# Patient Record
Sex: Female | Born: 1970 | Race: Black or African American | Hispanic: No | Marital: Single | Smoking: Current every day smoker
Health system: Southern US, Community
[De-identification: ages and names within clinical notes are randomized; demographics above are authoritative.]

## PROBLEM LIST (undated history)

## (undated) DIAGNOSIS — I10 Essential (primary) hypertension: Secondary | ICD-10-CM

## (undated) DIAGNOSIS — E119 Type 2 diabetes mellitus without complications: Secondary | ICD-10-CM

## (undated) DIAGNOSIS — K219 Gastro-esophageal reflux disease without esophagitis: Secondary | ICD-10-CM

## (undated) DIAGNOSIS — N200 Calculus of kidney: Secondary | ICD-10-CM

## (undated) DIAGNOSIS — E785 Hyperlipidemia, unspecified: Secondary | ICD-10-CM

## (undated) DIAGNOSIS — E669 Obesity, unspecified: Secondary | ICD-10-CM

## (undated) HISTORY — DX: Hyperlipidemia, unspecified: E78.5

## (undated) HISTORY — PX: TUBAL LIGATION: SHX77

## (undated) HISTORY — DX: Gastro-esophageal reflux disease without esophagitis: K21.9

## (undated) HISTORY — PX: HERNIA REPAIR: SHX51

## (undated) HISTORY — PX: LITHOTRIPSY: SUR834

## (undated) HISTORY — PX: ABLATION ON ENDOMETRIOSIS: SHX5787

---

## 2013-02-16 DIAGNOSIS — M546 Pain in thoracic spine: Secondary | ICD-10-CM | POA: Insufficient documentation

## 2013-02-16 DIAGNOSIS — G43909 Migraine, unspecified, not intractable, without status migrainosus: Secondary | ICD-10-CM | POA: Insufficient documentation

## 2013-02-16 DIAGNOSIS — R1310 Dysphagia, unspecified: Secondary | ICD-10-CM | POA: Insufficient documentation

## 2013-02-16 DIAGNOSIS — K439 Ventral hernia without obstruction or gangrene: Secondary | ICD-10-CM | POA: Insufficient documentation

## 2013-09-18 DIAGNOSIS — N920 Excessive and frequent menstruation with regular cycle: Secondary | ICD-10-CM | POA: Insufficient documentation

## 2013-09-18 DIAGNOSIS — R5381 Other malaise: Secondary | ICD-10-CM | POA: Insufficient documentation

## 2013-09-18 DIAGNOSIS — N949 Unspecified condition associated with female genital organs and menstrual cycle: Secondary | ICD-10-CM | POA: Insufficient documentation

## 2013-09-18 DIAGNOSIS — N921 Excessive and frequent menstruation with irregular cycle: Secondary | ICD-10-CM | POA: Insufficient documentation

## 2013-12-31 DIAGNOSIS — E871 Hypo-osmolality and hyponatremia: Secondary | ICD-10-CM | POA: Insufficient documentation

## 2013-12-31 DIAGNOSIS — F17200 Nicotine dependence, unspecified, uncomplicated: Secondary | ICD-10-CM | POA: Insufficient documentation

## 2013-12-31 DIAGNOSIS — D259 Leiomyoma of uterus, unspecified: Secondary | ICD-10-CM | POA: Insufficient documentation

## 2013-12-31 DIAGNOSIS — J309 Allergic rhinitis, unspecified: Secondary | ICD-10-CM | POA: Insufficient documentation

## 2013-12-31 DIAGNOSIS — R32 Unspecified urinary incontinence: Secondary | ICD-10-CM | POA: Insufficient documentation

## 2013-12-31 DIAGNOSIS — D509 Iron deficiency anemia, unspecified: Secondary | ICD-10-CM | POA: Insufficient documentation

## 2014-03-17 DIAGNOSIS — N83209 Unspecified ovarian cyst, unspecified side: Secondary | ICD-10-CM | POA: Insufficient documentation

## 2014-07-15 DIAGNOSIS — E876 Hypokalemia: Secondary | ICD-10-CM | POA: Insufficient documentation

## 2014-07-15 DIAGNOSIS — E282 Polycystic ovarian syndrome: Secondary | ICD-10-CM | POA: Insufficient documentation

## 2014-07-15 DIAGNOSIS — K5909 Other constipation: Secondary | ICD-10-CM | POA: Insufficient documentation

## 2014-07-15 DIAGNOSIS — E119 Type 2 diabetes mellitus without complications: Secondary | ICD-10-CM | POA: Insufficient documentation

## 2014-10-02 DIAGNOSIS — Z5181 Encounter for therapeutic drug level monitoring: Secondary | ICD-10-CM | POA: Insufficient documentation

## 2014-10-23 DIAGNOSIS — G5603 Carpal tunnel syndrome, bilateral upper limbs: Secondary | ICD-10-CM | POA: Insufficient documentation

## 2014-11-11 DIAGNOSIS — R0789 Other chest pain: Secondary | ICD-10-CM | POA: Insufficient documentation

## 2014-12-02 DIAGNOSIS — G894 Chronic pain syndrome: Secondary | ICD-10-CM | POA: Insufficient documentation

## 2015-01-20 DIAGNOSIS — S93402A Sprain of unspecified ligament of left ankle, initial encounter: Secondary | ICD-10-CM | POA: Insufficient documentation

## 2015-01-20 DIAGNOSIS — S8263XA Displaced fracture of lateral malleolus of unspecified fibula, initial encounter for closed fracture: Secondary | ICD-10-CM | POA: Insufficient documentation

## 2015-03-09 DIAGNOSIS — R102 Pelvic and perineal pain: Secondary | ICD-10-CM | POA: Insufficient documentation

## 2015-03-09 DIAGNOSIS — F410 Panic disorder [episodic paroxysmal anxiety] without agoraphobia: Secondary | ICD-10-CM | POA: Insufficient documentation

## 2015-03-09 DIAGNOSIS — R3589 Other polyuria: Secondary | ICD-10-CM | POA: Insufficient documentation

## 2015-03-09 DIAGNOSIS — F5101 Primary insomnia: Secondary | ICD-10-CM | POA: Insufficient documentation

## 2015-08-11 DIAGNOSIS — N1330 Unspecified hydronephrosis: Secondary | ICD-10-CM | POA: Insufficient documentation

## 2015-08-11 DIAGNOSIS — N201 Calculus of ureter: Secondary | ICD-10-CM | POA: Insufficient documentation

## 2017-03-07 ENCOUNTER — Emergency Department (HOSPITAL_BASED_OUTPATIENT_CLINIC_OR_DEPARTMENT_OTHER)
Admission: EM | Admit: 2017-03-07 | Discharge: 2017-03-07 | Disposition: A | Discharge status: Home / Self Care | Payer: Self-pay | Attending: Emergency Medicine | Admitting: Emergency Medicine

## 2017-03-07 ENCOUNTER — Encounter (HOSPITAL_BASED_OUTPATIENT_CLINIC_OR_DEPARTMENT_OTHER): Payer: Self-pay | Admitting: *Deleted

## 2017-03-07 ENCOUNTER — Emergency Department (HOSPITAL_BASED_OUTPATIENT_CLINIC_OR_DEPARTMENT_OTHER): Payer: Self-pay

## 2017-03-07 DIAGNOSIS — F17210 Nicotine dependence, cigarettes, uncomplicated: Secondary | ICD-10-CM | POA: Insufficient documentation

## 2017-03-07 DIAGNOSIS — E119 Type 2 diabetes mellitus without complications: Secondary | ICD-10-CM | POA: Insufficient documentation

## 2017-03-07 DIAGNOSIS — Z7984 Long term (current) use of oral hypoglycemic drugs: Secondary | ICD-10-CM | POA: Insufficient documentation

## 2017-03-07 DIAGNOSIS — Z79899 Other long term (current) drug therapy: Secondary | ICD-10-CM | POA: Insufficient documentation

## 2017-03-07 DIAGNOSIS — J181 Lobar pneumonia, unspecified organism: Secondary | ICD-10-CM

## 2017-03-07 DIAGNOSIS — J189 Pneumonia, unspecified organism: Secondary | ICD-10-CM | POA: Insufficient documentation

## 2017-03-07 DIAGNOSIS — I10 Essential (primary) hypertension: Secondary | ICD-10-CM | POA: Insufficient documentation

## 2017-03-07 HISTORY — DX: Essential (primary) hypertension: I10

## 2017-03-07 HISTORY — DX: Type 2 diabetes mellitus without complications: E11.9

## 2017-03-07 HISTORY — DX: Calculus of kidney: N20.0

## 2017-03-07 MED ORDER — ACETAMINOPHEN 325 MG PO TABS
650.0000 mg | ORAL_TABLET | Freq: Once | ORAL | Status: AC
  Administered 2017-03-07: 650 mg via ORAL
  Filled 2017-03-07: qty 2

## 2017-03-07 MED ORDER — IPRATROPIUM-ALBUTEROL 0.5-2.5 (3) MG/3ML IN SOLN
3.0000 mL | Freq: Four times a day (QID) | RESPIRATORY_TRACT | Status: DC
  Administered 2017-03-07: 3 mL via RESPIRATORY_TRACT
  Filled 2017-03-07: qty 3

## 2017-03-07 MED ORDER — ALBUTEROL SULFATE HFA 108 (90 BASE) MCG/ACT IN AERS: 1.0000 | INHALATION_SPRAY | Freq: Four times a day (QID) | RESPIRATORY_TRACT | 0 refills | Status: DC | PRN

## 2017-03-07 MED ORDER — ALBUTEROL SULFATE HFA 108 (90 BASE) MCG/ACT IN AERS
2.0000 | INHALATION_SPRAY | Freq: Four times a day (QID) | RESPIRATORY_TRACT | Status: DC
  Administered 2017-03-07: 2 via RESPIRATORY_TRACT
  Filled 2017-03-07: qty 6.7

## 2017-03-07 MED ORDER — AZITHROMYCIN 250 MG PO TABS
500.0000 mg | ORAL_TABLET | Freq: Once | ORAL | Status: AC
  Administered 2017-03-07: 500 mg via ORAL
  Filled 2017-03-07: qty 2

## 2017-03-07 MED ORDER — ALBUTEROL SULFATE HFA 108 (90 BASE) MCG/ACT IN AERS: 2.0000 | INHALATION_SPRAY | RESPIRATORY_TRACT | Status: DC | PRN

## 2017-03-07 MED ORDER — AZITHROMYCIN 250 MG PO TABS: 250.0000 mg | ORAL_TABLET | Freq: Once | ORAL | 0 refills | Status: AC

## 2017-03-07 NOTE — ED Triage Notes (Signed)
Pt c/o cough and SOB x 3 days

## 2017-03-07 NOTE — Discharge Instructions (Signed)
You had focal wheezing on your left lung, chest x-ray confirmed pneumonia. You were able to ambulate in the ED with normal oxygen saturations. You are considered appropriate for outpatient management of pneumonia with antibiotics.   We will treat your community-acquired pneumonia with this azythromycin. You received the first dose in the ED. Please take remaining doses as prescribed. Additionally use albuterol inhaler for associated chest tightness and cough. Drink plenty of fluids to ensure you're hydrated. Take ibuprofen and Tylenol for generalized body aches. You need a repeat chest x-ray within 1 week to ensure resolution of your ammonia. Please contact your primary care provider tomorrow to make an appointment for this chest x-ray. Monitor for signs of worsening illness including worsening cough, shortness of breath, chest pain, persistent fever, difficulty breathing.

## 2017-03-07 NOTE — ED Provider Notes (Signed)
[facility] Provider Note   CSN: [id number] Arrival date & time: 03/07/17  1916  By signing my name below, I, Dora Sims, attest that this documentation has been prepared under the direction and in the presence of Carmon Sails, PA-C. Electronically Signed: Dora Sims, Scribe. 03/07/2017. 7:57 PM.  History   Chief Complaint Chief Complaint  Patient presents with  . Cough   The history is provided by the patient. No language interpreter was used.    HPI Comments: Kristine Bell is a 46 y.o. female with PMHx of DM and HTN who presents to the Emergency Department complaining of a persistent cough that is occasionally productive of non-bloody sputum for three days. She reports associated rhinorrhea, sore throat, intermittent fevers, mild dyspnea, chest pain secondary to coughing, fatigue, nausea without vomiting, and generalized body aches. She has tried TheraFlu, DayQuil, and Alka-Seltzer Plus without relief. No known ill contacts with similar symptoms and she is not frequently exposed to children. No recent unusual outdoor exposure or tick bites. Patient smokes 3-4 cigarettes daily and has not smoked since onset of her cough and associated symptoms. No h/o CHF, COPD, or asthma. She denies abdominal pain, dysuria, hemoptysis, or any other associated symptoms.  Past Medical History:  Diagnosis Date  . Diabetes mellitus without complication ([place])   . Hypertension   . Kidney stones     There are no active problems to display for this patient.   Past Surgical History:  Procedure Laterality Date  . ABLATION ON ENDOMETRIOSIS    . HERNIA REPAIR    . LITHOTRIPSY    . TUBAL LIGATION      OB History    No data available       Home Medications    Prior to Admission medications   Medication Sig Start Date End Date Taking? Authorizing Provider  lisinopril (PRINIVIL,ZESTRIL) 20 MG tablet Take 20 mg by mouth daily.   Yes [provider]  metFORMIN  (GLUCOPHAGE) 500 MG tablet Take by mouth 2 (two) times daily with a meal.   Yes [provider]  albuterol (PROVENTIL HFA;VENTOLIN HFA) 108 (90 Base) MCG/ACT inhaler Inhale 1-2 puffs into the lungs every 6 (six) hours as needed for wheezing or shortness of breath. 03/07/17   Kinnie Feil, PA-C  azithromycin (ZITHROMAX Z-PAK) 250 MG tablet Take 1 tablet (250 mg total) by mouth once. 03/07/17 03/07/17  Kinnie Feil, PA-C    Family History History reviewed. No pertinent family history.  Social History Social History  Substance Use Topics  . Smoking status: Current Every Day Smoker    Packs/day: 0.50    Types: Cigarettes  . Smokeless tobacco: Never Used  . Alcohol use No     Allergies   Naproxen and Tramadol   Review of Systems Review of Systems  Constitutional: Positive for fatigue and fever.  HENT: Positive for rhinorrhea and sore throat.   Respiratory: Positive for cough and shortness of breath.        Negative for hemoptysis.  Cardiovascular: Positive for chest pain (secondary to coughing).  Gastrointestinal: Positive for nausea. Negative for abdominal pain and vomiting.  Genitourinary: Negative for dysuria.  Musculoskeletal: Positive for myalgias.   Physical Exam Updated Vital Signs BP (!) 163/82 (BP Location: Left Arm)   Pulse 79   Temp (!) 100.4 F (38 C) (Oral)   Resp 20   Ht 5\' 5"  (1.651 m)   Wt 136.1 kg (300 lb)   SpO2 99%   BMI 49.92  kg/m   Physical Exam  Constitutional: She is oriented to person, place, and time. She appears well-developed and well-nourished. No distress.  NAD.  HENT:  Head: Normocephalic and atraumatic.  Right Ear: External ear normal.  Left Ear: External ear normal.  Nose: Nose normal.  Moderate mucosal edema bilaterally. Tonsils are erythematous without edema, exudates, or petechiae.  Eyes: Conjunctivae and EOM are normal. No scleral icterus.  Neck: Normal range of motion. Neck supple.  Cardiovascular: Normal  rate, regular rhythm and normal heart sounds.   No murmur heard. No signs of fluid overload including lower extremity edema or JVD. DP and radial pulses are 2+ bilaterally.  Pulmonary/Chest: Effort normal. She has wheezes. She has rhonchi. She exhibits tenderness (right anterior chest wall).  Expiratory wheezing and rhonchi to all fields of the left lung. Lung sounds on the right are normal.  Musculoskeletal: Normal range of motion. She exhibits no deformity.  Lymphadenopathy:       Head (right side): Submandibular adenopathy present.       Head (left side): Submandibular adenopathy present.  Tender submandibular lymphadenopathy that is symmetric.  Neurological: She is alert and oriented to person, place, and time.  Skin: Skin is warm and dry. Capillary refill takes less than 2 seconds.  Psychiatric: She has a normal mood and affect. Her behavior is normal. Judgment and thought content normal.  Nursing note and vitals reviewed.  ED Treatments / Results  Labs (all labs ordered are listed, but only abnormal results are displayed) Labs Reviewed - No data to display  EKG  EKG Interpretation None       Radiology Dg Chest 2 View  Result Date: 03/07/2017 CLINICAL DATA:  Fever and cough for 3 days. EXAM: CHEST  2 VIEW COMPARISON:  None. FINDINGS: Focal opacity obscures the left heart border, suggesting pneumonia. Right lung is clear. Cardiopericardial silhouette is upper limits of normal for size to borderline enlarged. The visualized bony structures of the thorax are intact. IMPRESSION: 1. Focal opacity left mid lung suspicious for pneumonia. Follow-up chest x-ray recommended to ensure resolution. 2. Borderline enlargement cardiopericardial silhouette. Electronically Signed   By: Misty Stanley M.D.   On: 03/07/2017 20:12    Procedures Procedures (including critical care time)  DIAGNOSTIC STUDIES: Oxygen Saturation is 99% on RA, normal by my interpretation.    COORDINATION OF  CARE: 7:39 PM Discussed treatment plan with pt at bedside and pt agreed to plan.  Medications Ordered in ED Medications  ipratropium-albuterol (DUONEB) 0.5-2.5 (3) MG/3ML nebulizer solution 3 mL (3 mLs Nebulization Given 03/07/17 2108)  acetaminophen (TYLENOL) tablet 650 mg (650 mg Oral Given 03/07/17 2102)  azithromycin (ZITHROMAX) tablet 500 mg (500 mg Oral Given 03/07/17 2145)     Initial Impression / Assessment and Plan / ED Course  I have reviewed the triage vital signs and the nursing notes.  Pertinent labs & imaging results that were available during my care of the patient were reviewed by me and considered in my medical decision making (see chart for details).  Clinical Course as of Mar 07 2145  Tue Mar 07, 2017  2058 IMPRESSION: 1. Focal opacity left mid lung suspicious for pneumonia. Follow-up chest x-ray recommended to ensure resolution. 2. Borderline enlargement cardiopericardial silhouette. Temp: (!) 100.4 F (38 C) [CG]    Clinical Course User Index [CG] Kinnie Feil, PA-C   46 y.o. yo female with pertinent pmh of non insulin dependent DM, HTN and tobacco abuse presents to ED with signs and symptoms  suggestive of community acquired pneumonia including fever, cough, pleuritic chest pain, SOB, and sputum production.  On physical examination,patient is febrile.  Patient is non toxic appearing, speaking in full sentences with no signs of respiratory distress.  Chest examination revealed focal wheezing and rhonchi to LLB suggestive of consolidation. CXR done today revealed L infiltrate.  Given reassuring vital signs lab work not indicated at this time.  Given physical exam findings, age, comorbidities pt is considered low risk for complications with outpatient antibiotic tx for CAP and that outpatient abx is appropriate.  Will discharge patient with azithromycin and adjunctive symptomatic treatment with close PCP f/u for repeat CXR to ensure clearance.  Pt ambulated in ED and  SpO2 remained >95%. Patient verbalized understanding and is agreeable to discharge plan.  Strict ED return precautions given, patient is aware of red flag symptoms to monitor for that would warrant return to ED for further evaluation.  Vital signs were within normal limits prior to discharge.   Final Clinical Impressions(s) / ED Diagnoses   Final diagnoses:  Community acquired pneumonia of left lower lobe of lung (HCC)    New Prescriptions New Prescriptions   ALBUTEROL (PROVENTIL HFA;VENTOLIN HFA) 108 (90 BASE) MCG/ACT INHALER    Inhale 1-2 puffs into the lungs every 6 (six) hours as needed for wheezing or shortness of breath.   AZITHROMYCIN (ZITHROMAX Z-PAK) 250 MG TABLET    Take 1 tablet (250 mg total) by mouth once.   I personally performed the services described in this documentation, which was scribed in my presence. The recorded information has been reviewed and is accurate.    Arlean Hopping 03/07/17 2147    Quintella Reichert, MD 03/08/17 1455

## 2017-03-07 NOTE — ED Notes (Signed)
SPO2 as low as 94% and as high 97%. HR ranged between 92-190. RN and PA notified.

## 2017-03-07 NOTE — ED Notes (Signed)
Pt verbalizes understanding of d/c instructions and denies any further needs at this time. 

## 2017-03-08 ENCOUNTER — Telehealth (HOSPITAL_BASED_OUTPATIENT_CLINIC_OR_DEPARTMENT_OTHER): Payer: Self-pay | Admitting: *Deleted

## 2017-03-08 MED FILL — AZITHROMYCIN 250 MG TABLET: 250 | 4 days supply | Qty: 4 | Fill #0

## 2017-03-08 NOTE — Telephone Encounter (Signed)
Pt called requesting financial assistance with Rx received yesterday for zithromax. Advised pt it would be $9 to have it filled here at in the [facility] or she could contact [facility] and they could advise her if they could fill it more inexpensively. Pt verbalized understanding

## 2017-09-08 ENCOUNTER — Encounter (HOSPITAL_BASED_OUTPATIENT_CLINIC_OR_DEPARTMENT_OTHER): Payer: Self-pay | Admitting: Emergency Medicine

## 2017-09-08 ENCOUNTER — Emergency Department (HOSPITAL_BASED_OUTPATIENT_CLINIC_OR_DEPARTMENT_OTHER): Payer: Self-pay

## 2017-09-08 ENCOUNTER — Other Ambulatory Visit: Payer: Self-pay

## 2017-09-08 ENCOUNTER — Emergency Department (HOSPITAL_BASED_OUTPATIENT_CLINIC_OR_DEPARTMENT_OTHER)
Admission: EM | Admit: 2017-09-08 | Discharge: 2017-09-08 | Disposition: A | Discharge status: Home / Self Care | Payer: Self-pay | Attending: Emergency Medicine | Admitting: Emergency Medicine

## 2017-09-08 DIAGNOSIS — Z79899 Other long term (current) drug therapy: Secondary | ICD-10-CM | POA: Insufficient documentation

## 2017-09-08 DIAGNOSIS — Z7984 Long term (current) use of oral hypoglycemic drugs: Secondary | ICD-10-CM | POA: Insufficient documentation

## 2017-09-08 DIAGNOSIS — J101 Influenza due to other identified influenza virus with other respiratory manifestations: Secondary | ICD-10-CM

## 2017-09-08 DIAGNOSIS — F17210 Nicotine dependence, cigarettes, uncomplicated: Secondary | ICD-10-CM | POA: Insufficient documentation

## 2017-09-08 DIAGNOSIS — E119 Type 2 diabetes mellitus without complications: Secondary | ICD-10-CM | POA: Insufficient documentation

## 2017-09-08 DIAGNOSIS — I10 Essential (primary) hypertension: Secondary | ICD-10-CM | POA: Insufficient documentation

## 2017-09-08 DIAGNOSIS — R6889 Other general symptoms and signs: Secondary | ICD-10-CM

## 2017-09-08 DIAGNOSIS — J09X2 Influenza due to identified novel influenza A virus with other respiratory manifestations: Secondary | ICD-10-CM | POA: Insufficient documentation

## 2017-09-08 LAB — CBC WITH DIFFERENTIAL/PLATELET
Basophils Absolute: 0.1 10*3/uL (ref 0.0–0.1)
Basophils Relative: 1 %
Eosinophils Absolute: 0.1 10*3/uL (ref 0.0–0.7)
Eosinophils Relative: 1 %
HCT: 36.4 % (ref 36.0–46.0)
Hemoglobin: 11.8 g/dL — ABNORMAL LOW (ref 12.0–15.0)
Lymphocytes Relative: 8 %
Lymphs Abs: 0.8 10*3/uL (ref 0.7–4.0)
MCH: 26.8 pg (ref 26.0–34.0)
MCHC: 32.4 g/dL (ref 30.0–36.0)
MCV: 82.5 fL (ref 78.0–100.0)
Monocytes Absolute: 0.7 10*3/uL (ref 0.1–1.0)
Monocytes Relative: 7 %
Neutro Abs: 8.7 10*3/uL — ABNORMAL HIGH (ref 1.7–7.7)
Neutrophils Relative %: 85 %
Platelets: 232 10*3/uL (ref 150–400)
RBC: 4.41 MIL/uL (ref 3.87–5.11)
RDW: 15.2 % (ref 11.5–15.5)
WBC: 10.3 10*3/uL (ref 4.0–10.5)

## 2017-09-08 LAB — URINALYSIS, ROUTINE W REFLEX MICROSCOPIC
Bilirubin Urine: NEGATIVE
GLUCOSE, UA: NEGATIVE mg/dL
KETONES UR: NEGATIVE mg/dL
LEUKOCYTES UA: NEGATIVE
Nitrite: NEGATIVE
PH: 6 (ref 5.0–8.0)
PROTEIN: NEGATIVE mg/dL
Specific Gravity, Urine: 1.02 (ref 1.005–1.030)

## 2017-09-08 LAB — COMPREHENSIVE METABOLIC PANEL
ALT: 16 U/L (ref 14–54)
AST: 28 U/L (ref 15–41)
Albumin: 3.9 g/dL (ref 3.5–5.0)
Alkaline Phosphatase: 89 U/L (ref 38–126)
Anion gap: 10 (ref 5–15)
BUN: 12 mg/dL (ref 6–20)
CO2: 21 mmol/L — ABNORMAL LOW (ref 22–32)
Calcium: 9.8 mg/dL (ref 8.9–10.3)
Chloride: 104 mmol/L (ref 101–111)
Creatinine, Ser: 0.51 mg/dL (ref 0.44–1.00)
GFR calc Af Amer: >60 mL/min (ref >60)
GFR calc non Af Amer: >60 mL/min (ref >60)
Glucose, Bld: 118 mg/dL — ABNORMAL HIGH (ref 65–99)
Potassium: 3.4 mmol/L — ABNORMAL LOW (ref 3.5–5.1)
Sodium: 135 mmol/L (ref 135–145)
Total Bilirubin: 0.4 mg/dL (ref 0.3–1.2)
Total Protein: 7.3 g/dL (ref 6.5–8.1)

## 2017-09-08 LAB — URINALYSIS, MICROSCOPIC (REFLEX): WBC, UA: NONE SEEN WBC/hpf (ref 0–5)

## 2017-09-08 LAB — INFLUENZA PANEL BY PCR (TYPE A & B)
Influenza A By PCR: POSITIVE — AB
Influenza B By PCR: NEGATIVE

## 2017-09-08 LAB — RAPID STREP SCREEN (MED CTR MEBANE ONLY): Streptococcus, Group A Screen (Direct): NEGATIVE

## 2017-09-08 MED ORDER — OSELTAMIVIR PHOSPHATE 75 MG PO CAPS: 75.0000 mg | ORAL_CAPSULE | Freq: Two times a day (BID) | ORAL | 0 refills | Status: AC

## 2017-09-08 MED ORDER — BENZONATATE 100 MG PO CAPS: 100.0000 mg | ORAL_CAPSULE | Freq: Three times a day (TID) | ORAL | 0 refills | Status: DC

## 2017-09-08 MED ORDER — KETOROLAC TROMETHAMINE 15 MG/ML IJ SOLN
15.0000 mg | Freq: Once | INTRAMUSCULAR | Status: AC
  Administered 2017-09-08: 15 mg via INTRAVENOUS
  Filled 2017-09-08: qty 1

## 2017-09-08 MED ORDER — ONDANSETRON 4 MG PO TBDP
4.0000 mg | ORAL_TABLET | Freq: Once | ORAL | Status: AC
  Administered 2017-09-08: 4 mg via ORAL
  Filled 2017-09-08: qty 1

## 2017-09-08 MED ORDER — ACETAMINOPHEN 325 MG PO TABS
650.0000 mg | ORAL_TABLET | Freq: Once | ORAL | Status: AC
  Administered 2017-09-08: 650 mg via ORAL
  Filled 2017-09-08: qty 2

## 2017-09-08 MED ORDER — ONDANSETRON 4 MG PO TBDP: 4.0000 mg | ORAL_TABLET | Freq: Three times a day (TID) | ORAL | 0 refills | Status: DC | PRN

## 2017-09-08 MED ORDER — CYCLOBENZAPRINE HCL 10 MG PO TABS: 10.0000 mg | ORAL_TABLET | Freq: Two times a day (BID) | ORAL | 0 refills | Status: DC | PRN

## 2017-09-08 MED ORDER — ACETAMINOPHEN 500 MG PO TABS
1000.0000 mg | ORAL_TABLET | Freq: Once | ORAL | Status: AC
  Administered 2017-09-08: 1000 mg via ORAL
  Filled 2017-09-08: qty 2

## 2017-09-08 MED ORDER — ALBUTEROL SULFATE HFA 108 (90 BASE) MCG/ACT IN AERS
2.0000 | INHALATION_SPRAY | Freq: Once | RESPIRATORY_TRACT | Status: AC
  Administered 2017-09-08: 2 via RESPIRATORY_TRACT
  Filled 2017-09-08: qty 6.7

## 2017-09-08 MED ORDER — OSELTAMIVIR PHOSPHATE 75 MG PO CAPS
75.0000 mg | ORAL_CAPSULE | Freq: Once | ORAL | Status: AC
  Administered 2017-09-08: 75 mg via ORAL
  Filled 2017-09-08: qty 1

## 2017-09-08 MED ORDER — AMLODIPINE BESYLATE 5 MG PO TABS: 5.0000 mg | ORAL_TABLET | Freq: Every day | ORAL | 0 refills | Status: DC

## 2017-09-08 MED ORDER — SODIUM CHLORIDE 0.9 % IV BOLUS (SEPSIS)
1000.0000 mL | Freq: Once | INTRAVENOUS | Status: AC
  Administered 2017-09-08: 1000 mL via INTRAVENOUS

## 2017-09-08 MED FILL — AMLODIPINE BESYLATE 5 MG TA: 5 | 30 days supply | Qty: 30 | Fill #0

## 2017-09-08 MED FILL — BENZONATATE 100 MG CAPSULE: 100 | 7 days supply | Qty: 21 | Fill #0

## 2017-09-08 NOTE — ED Provider Notes (Signed)
[facility] EMERGENCY DEPARTMENT Provider Note   CSN: [id number] Arrival date & time: 09/08/17  1517     History   Chief Complaint Chief Complaint  Patient presents with  . Cough    HPI Kristine Bell is a 46 y.o. female.  HPI   Body aches, pain in head, back, generalized weakness, nausea, sore throat, coughing, pain with cough. Started yesterday.  Subjective fever at home, broke out in sweat, was trying to drink liquids felt was dehydrated.  Feeling cold.  No dysuria.  Back pain is all over, aching is all over. Daughter, granddaughter also sick.  No known flu. Did not get flu shot    Past Medical History:  Diagnosis Date  . Diabetes mellitus without complication ([place])   . Hypertension   . Kidney stones     There are no active problems to display for this patient.   Past Surgical History:  Procedure Laterality Date  . ABLATION ON ENDOMETRIOSIS    . HERNIA REPAIR    . LITHOTRIPSY    . TUBAL LIGATION      OB History    No data available       Home Medications    Prior to Admission medications   Medication Sig Start Date End Date Taking? Authorizing Provider  albuterol (PROVENTIL HFA;VENTOLIN HFA) 108 (90 Base) MCG/ACT inhaler Inhale 1-2 puffs into the lungs every 6 (six) hours as needed for wheezing or shortness of breath. 03/07/17   Kinnie Feil, PA-C  amLODipine (NORVASC) 5 MG tablet Take 1 tablet (5 mg total) by mouth daily. 09/08/17   Gareth Morgan, MD  benzonatate (TESSALON) 100 MG capsule Take 1 capsule (100 mg total) by mouth every 8 (eight) hours. 09/08/17   Gareth Morgan, MD  cyclobenzaprine (FLEXERIL) 10 MG tablet Take 1 tablet (10 mg total) by mouth 2 (two) times daily as needed for muscle spasms. 09/08/17   Gareth Morgan, MD  lisinopril (PRINIVIL,ZESTRIL) 20 MG tablet Take 20 mg by mouth daily.    [provider]  metFORMIN (GLUCOPHAGE) 500 MG tablet Take by mouth 2 (two) times daily with a meal.    [provider]  ondansetron (ZOFRAN ODT) 4 MG disintegrating tablet Take 1 tablet (4 mg total) by mouth every 8 (eight) hours as needed for nausea or vomiting. 09/08/17   Gareth Morgan, MD  oseltamivir (TAMIFLU) 75 MG capsule Take 1 capsule (75 mg total) by mouth every 12 (twelve) hours for 5 days. 09/08/17 09/13/17  Gareth Morgan, MD    Family History History reviewed. No pertinent family history.  Social History Social History   Tobacco Use  . Smoking status: Current Every Day Smoker    Packs/day: 0.50    Types: Cigarettes  . Smokeless tobacco: Never Used  Substance Use Topics  . Alcohol use: No  . Drug use: No     Allergies   Naproxen and Tramadol   Review of Systems Review of Systems  Constitutional: Positive for appetite change, chills, fatigue and fever.  HENT: Positive for congestion and sore throat.   Eyes: Negative for visual disturbance.  Respiratory: Positive for cough.   Cardiovascular: Chest pain: with cough.  Gastrointestinal: Positive for nausea. Negative for abdominal pain, diarrhea and vomiting.  Genitourinary: Negative for dysuria.  Musculoskeletal: Positive for back pain and myalgias.  Skin: Negative for rash.  Neurological: Positive for headaches.     Physical Exam Updated Vital Signs BP 111/72 (BP Location: Right Arm)   Pulse 82  Temp (!) 100.6 F (38.1 C) (Oral)   Resp 20   Ht 5\' 5"  (1.651 m)   Wt (!) 140.6 kg (310 lb)   LMP 09/08/2017   SpO2 96%   BMI 51.59 kg/m   Physical Exam  Constitutional: She is oriented to person, place, and time. She appears well-developed and well-nourished. She appears ill. No distress.  HENT:  Head: Normocephalic and atraumatic.  Eyes: Conjunctivae and EOM are normal.  Neck: Normal range of motion.  Cardiovascular: Normal rate, regular rhythm, normal heart sounds and intact distal pulses. Exam reveals no gallop and no friction rub.  No murmur heard. Pulmonary/Chest: Effort normal and breath sounds  normal. No respiratory distress. She has no wheezes. She has no rales.  Abdominal: Soft. She exhibits no distension. There is no tenderness. There is no guarding.  Musculoskeletal: She exhibits no edema or tenderness.  Neurological: She is alert and oriented to person, place, and time.  Skin: Skin is warm and dry. No rash noted. She is not diaphoretic. No erythema.  Nursing note and vitals reviewed.    ED Treatments / Results  Labs (all labs ordered are listed, but only abnormal results are displayed) Labs Reviewed  URINALYSIS, ROUTINE W REFLEX MICROSCOPIC - Abnormal; Notable for the following components:      Result Value   Hgb urine dipstick LARGE (*)    All other components within normal limits  CBC WITH DIFFERENTIAL/PLATELET - Abnormal; Notable for the following components:   Hemoglobin 11.8 (*)    Neutro Abs 8.7 (*)    All other components within normal limits  COMPREHENSIVE METABOLIC PANEL - Abnormal; Notable for the following components:   Potassium 3.4 (*)    CO2 21 (*)    Glucose, Bld 118 (*)    All other components within normal limits  INFLUENZA PANEL BY PCR (TYPE A & B) - Abnormal; Notable for the following components:   Influenza A By PCR POSITIVE (*)    All other components within normal limits  URINALYSIS, MICROSCOPIC (REFLEX) - Abnormal; Notable for the following components:   Bacteria, UA RARE (*)    Squamous Epithelial / LPF 0-5 (*)    All other components within normal limits  RAPID STREP SCREEN (NOT AT [facility])  CULTURE, GROUP A STREP [facility])  URINE CULTURE    EKG  EKG Interpretation None       Radiology Dg Chest 2 View  Result Date: 09/08/2017 CLINICAL DATA:  Cough and congestion EXAM: CHEST  2 VIEW COMPARISON:  03/07/2017 FINDINGS: Mild cardiomegaly. Mild bronchitic changes bilaterally. No pleural effusion or consolidation. No pneumothorax. IMPRESSION: 1. Mild bronchitic changes.  No focal pulmonary opacity 2. Mild cardiomegaly Electronically Signed    By: Donavan Foil M.D.   On: 09/08/2017 15:48    Procedures Procedures (including critical care time)  Medications Ordered in ED Medications  ondansetron (ZOFRAN-ODT) disintegrating tablet 4 mg (4 mg Oral Given 09/08/17 1703)  albuterol (PROVENTIL HFA;VENTOLIN HFA) 108 (90 Base) MCG/ACT inhaler 2 puff (2 puffs Inhalation Given 09/08/17 1747)  acetaminophen (TYLENOL) tablet 1,000 mg (1,000 mg Oral Given 09/08/17 1844)  ondansetron (ZOFRAN-ODT) disintegrating tablet 4 mg (4 mg Oral Given 09/08/17 1910)  sodium chloride 0.9 % bolus 1,000 mL (0 mLs Intravenous Stopped 09/08/17 2111)  ketorolac (TORADOL) 15 MG/ML injection 15 mg (15 mg Intravenous Given 09/08/17 1949)  oseltamivir (TAMIFLU) capsule 75 mg (75 mg Oral Given 09/08/17 2118)  acetaminophen (TYLENOL) tablet 650 mg (650 mg Oral Given 09/08/17 2119)  Initial Impression / Assessment and Plan / ED Course  I have reviewed the triage vital signs and the nursing notes.  Pertinent labs & imaging results that were available during my care of the patient were reviewed by me and considered in my medical decision making (see chart for details).    46 year old female with a history of diabetes, Hypertension, presents with concern for cough, fever, body aches.  Chest x-ray done shows no evidence of pneumonia.  Labs show no significant electrolyte abnormalities or leukocytosis.  Urinalysis shows no sign of UTI.  Discussed that symptoms are consistent with suspected influenza.  Patient was given IV fluids hydration in the emergency department.  Vital signs improved, with temperature 100.6.  Given her medical history, she was given a prescription with for Tamiflu, but reports she feels it is too expensive to fill.  Discussed both risks and potential benefits of the medication.  Did give her a dose of Tamiflu in the emergency department.  Gave prescription for Tessalon Perles, Flexeril, recommend Tylenol, ibuprofen.  Give albuterol inhaler to use as  needed cough. Pt also reports she is out of blood pressure medication, given rx for amlodipine. Patient discharged in stable condition with understanding of reasons to return.  Influenza test returned positive for Influenza A. Called pt to notify her.   Final Clinical Impressions(s) / ED Diagnoses   Final diagnoses:  Flu-like symptoms  Influenza A    ED Discharge Orders        Ordered    oseltamivir (TAMIFLU) 75 MG capsule  Every 12 hours     09/08/17 1743    amLODipine (NORVASC) 5 MG tablet  Daily     09/08/17 1744    benzonatate (TESSALON) 100 MG capsule  Every 8 hours     09/08/17 1744    ondansetron (ZOFRAN ODT) 4 MG disintegrating tablet  Every 8 hours PRN     09/08/17 1744    cyclobenzaprine (FLEXERIL) 10 MG tablet  2 times daily PRN     09/08/17 1744       Gareth Morgan, MD 09/09/17 [phone]

## 2017-09-08 NOTE — ED Notes (Signed)
Patient reports she feels nauseated.  Given emesis bag.  Will notify provider.

## 2017-09-08 NOTE — ED Notes (Signed)
Patient reports still unable to provide urine sample despite being given water to drink.

## 2017-09-08 NOTE — ED Triage Notes (Signed)
Patient states that she has a cough and generalized aches and pains. The patient also states that he stomach hurts and she is having N/V/D

## 2017-09-10 LAB — URINE CULTURE

## 2017-09-11 LAB — CULTURE, GROUP A STREP (THRC)

## 2018-01-02 ENCOUNTER — Other Ambulatory Visit: Payer: Self-pay

## 2018-01-02 ENCOUNTER — Encounter (HOSPITAL_BASED_OUTPATIENT_CLINIC_OR_DEPARTMENT_OTHER): Payer: Self-pay | Admitting: *Deleted

## 2018-01-02 ENCOUNTER — Emergency Department (HOSPITAL_BASED_OUTPATIENT_CLINIC_OR_DEPARTMENT_OTHER)
Admission: EM | Admit: 2018-01-02 | Discharge: 2018-01-02 | Disposition: A | Discharge status: Home / Self Care | Payer: Self-pay | Attending: Emergency Medicine | Admitting: Emergency Medicine

## 2018-01-02 DIAGNOSIS — I10 Essential (primary) hypertension: Secondary | ICD-10-CM | POA: Insufficient documentation

## 2018-01-02 DIAGNOSIS — J3489 Other specified disorders of nose and nasal sinuses: Secondary | ICD-10-CM | POA: Insufficient documentation

## 2018-01-02 DIAGNOSIS — R51 Headache: Secondary | ICD-10-CM | POA: Insufficient documentation

## 2018-01-02 DIAGNOSIS — Z79899 Other long term (current) drug therapy: Secondary | ICD-10-CM | POA: Insufficient documentation

## 2018-01-02 DIAGNOSIS — M791 Myalgia, unspecified site: Secondary | ICD-10-CM | POA: Insufficient documentation

## 2018-01-02 DIAGNOSIS — R0981 Nasal congestion: Secondary | ICD-10-CM | POA: Insufficient documentation

## 2018-01-02 DIAGNOSIS — E119 Type 2 diabetes mellitus without complications: Secondary | ICD-10-CM | POA: Insufficient documentation

## 2018-01-02 DIAGNOSIS — Z7984 Long term (current) use of oral hypoglycemic drugs: Secondary | ICD-10-CM | POA: Insufficient documentation

## 2018-01-02 DIAGNOSIS — F17210 Nicotine dependence, cigarettes, uncomplicated: Secondary | ICD-10-CM | POA: Insufficient documentation

## 2018-01-02 MED ORDER — FLUTICASONE PROPIONATE 50 MCG/ACT NA SUSP: 2.0000 | Freq: Every day | NASAL | 0 refills | Status: DC

## 2018-01-02 NOTE — ED Triage Notes (Signed)
Body aches, head pressure and nasal congestion since last night.

## 2018-01-02 NOTE — ED Provider Notes (Signed)
[facility] EMERGENCY DEPARTMENT Provider Note   CSN: [id number] Arrival date & time: 01/02/18  1357     History   Chief Complaint Chief Complaint  Patient presents with  . Generalized Body Aches    HPI Kristine Bell is a 47 y.o. female.  HPI   Patient is a 47 year old female with a history of T2 DM, hypertension, kidney stones who presents the ED today complaining of nasal congestion, nasal pressure, rhinorrhea, body aches that began yesterday.  She reports that sinus headache as well that is not the worse headache of life.  Began gradually.  Denies vision changes, numbness, weakness, lightheadedness, dizziness.  Denies chest pain or shortness of breath.  No abdominal pain, nausea, vomiting, diarrhea, urinary symptoms.  No sore throat or ear fullness bilaterally.  No cough.  She has a history of seasonal allergies and has been taking Claritin at home with no relief.  Past Medical History:  Diagnosis Date  . Diabetes mellitus without complication ([place])   . Hypertension   . Kidney stones     There are no active problems to display for this patient.   Past Surgical History:  Procedure Laterality Date  . ABLATION ON ENDOMETRIOSIS    . HERNIA REPAIR    . LITHOTRIPSY    . TUBAL LIGATION       OB History   None      Home Medications    Prior to Admission medications   Medication Sig Start Date End Date Taking? Authorizing Provider  albuterol (PROVENTIL HFA;VENTOLIN HFA) 108 (90 Base) MCG/ACT inhaler Inhale 1-2 puffs into the lungs every 6 (six) hours as needed for wheezing or shortness of breath. 03/07/17  Yes Carmon Sails J, PA-C  amLODipine (NORVASC) 5 MG tablet Take 1 tablet (5 mg total) by mouth daily. 09/08/17   Gareth Morgan, MD  benzonatate (TESSALON) 100 MG capsule Take 1 capsule (100 mg total) by mouth every 8 (eight) hours. 09/08/17   Gareth Morgan, MD  cyclobenzaprine (FLEXERIL) 10 MG tablet Take 1 tablet (10 mg total) by mouth 2 (two) times  daily as needed for muscle spasms. 09/08/17   Gareth Morgan, MD  fluticasone (FLONASE) 50 MCG/ACT nasal spray Place 2 sprays into both nostrils daily. 01/02/18   Shatasia Cutshaw S, PA-C  lisinopril (PRINIVIL,ZESTRIL) 20 MG tablet Take 20 mg by mouth daily.    [provider]  metFORMIN (GLUCOPHAGE) 500 MG tablet Take by mouth 2 (two) times daily with a meal.    [provider]  ondansetron (ZOFRAN ODT) 4 MG disintegrating tablet Take 1 tablet (4 mg total) by mouth every 8 (eight) hours as needed for nausea or vomiting. 09/08/17   Gareth Morgan, MD    Family History No family history on file.  Social History Social History   Tobacco Use  . Smoking status: Current Every Day Smoker    Packs/day: 0.50    Types: Cigarettes  . Smokeless tobacco: Never Used  Substance Use Topics  . Alcohol use: No  . Drug use: No     Allergies   Naproxen and Tramadol   Review of Systems Review of Systems  Constitutional: Negative for fever.  HENT: Positive for congestion, rhinorrhea, sinus pressure and sinus pain. Negative for ear pain and sore throat.   Eyes: Negative for visual disturbance.  Respiratory: Negative for cough and shortness of breath.   Cardiovascular: Negative for chest pain.  Gastrointestinal: Negative for abdominal pain, constipation, diarrhea, nausea and vomiting.  Genitourinary: Negative for  dysuria and hematuria.  Musculoskeletal: Positive for myalgias. Negative for arthralgias and back pain.  Skin: Negative for color change and rash.  Neurological: Positive for headaches. Negative for dizziness, seizures and syncope.  All other systems reviewed and are negative.    Physical Exam Updated Vital Signs BP (!) 155/69 (BP Location: Right Arm)   Pulse 73   Temp 98.3 F (36.8 C) (Oral)   Resp 20   Ht 5\' 5"  (1.651 m)   Wt 127 kg (280 lb)   SpO2 99%   BMI 46.59 kg/m   Physical Exam  Constitutional: She is oriented to person, place, and time. She  appears well-developed and well-nourished. No distress.  HENT:  Head: Normocephalic and atraumatic.  Right Ear: External ear normal.  Left Ear: External ear normal.  Mouth/Throat: Oropharynx is clear and moist.  No pharyngeal erythema.  No tonsillar swelling or exudate.  Uvula midline.  Right TM normal, left TM obstructed by cerumen.  Bilateral nasal turbinates are boggy.  Eyes: Pupils are equal, round, and reactive to light. Conjunctivae and EOM are normal.  Neck: Neck supple.  Cardiovascular: Normal rate, regular rhythm and normal heart sounds.  No murmur heard. Pulmonary/Chest: Effort normal and breath sounds normal. No stridor. No respiratory distress. She has no wheezes. She has no rales.  Abdominal: Soft. Bowel sounds are normal. There is no tenderness.  Musculoskeletal: Normal range of motion.  Lymphadenopathy:    She has no cervical adenopathy.  Neurological: She is alert and oriented to person, place, and time. No cranial nerve deficit.  5/5 strength of bilateral upper and lower extremities.  Skin: Skin is warm and dry. Capillary refill takes less than 2 seconds.  Psychiatric: She has a normal mood and affect.  Nursing note and vitals reviewed.    ED Treatments / Results  Labs (all labs ordered are listed, but only abnormal results are displayed) Labs Reviewed - No data to display  EKG None  Radiology No results found.  Procedures Procedures (including critical care time)  Medications Ordered in ED Medications - No data to display   Initial Impression / Assessment and Plan / ED Course  I have reviewed the triage vital signs and the nursing notes.  Pertinent labs & imaging results that were available during my care of the patient were reviewed by me and considered in my medical decision making (see chart for details).     Final Clinical Impressions(s) / ED Diagnoses   Final diagnoses:  Nasal congestion   Patient presenting with symptoms consistent with  seasonal allergies.  Vital signs are stable and patient is afebrile.  Nontoxic and nonseptic appearing.  Physical exam was within normal limits.  HEENT exam is grossly normal without evidence of bacterial infection.  Will give symptomatic treatment with allergy medications.  Advise follow-up with PCP in 1 week and discussed reasons to return immediately to the ED.  All questions answered and patient understands the plan and reasons to return.  ED Discharge Orders        Ordered    fluticasone (FLONASE) 50 MCG/ACT nasal spray  Daily     01/02/18 1501       Rodney Booze, PA-C 01/02/18 1501    Davonna Belling, MD 01/02/18 1542

## 2018-01-02 NOTE — Discharge Instructions (Signed)
You were given a perception for flonase to treat your allergies.  Please take this daily and make sure to stay hydrated while you are taking it.  Please follow-up with your primary care doctor next week.  Return to the emergency department for any new or worsening symptoms.

## 2018-07-19 ENCOUNTER — Other Ambulatory Visit: Payer: Self-pay

## 2018-07-19 ENCOUNTER — Emergency Department (HOSPITAL_BASED_OUTPATIENT_CLINIC_OR_DEPARTMENT_OTHER)
Admission: EM | Admit: 2018-07-19 | Discharge: 2018-07-19 | Disposition: A | Discharge status: Home / Self Care | Payer: Medicaid Other | Attending: Emergency Medicine | Admitting: Emergency Medicine

## 2018-07-19 ENCOUNTER — Encounter (HOSPITAL_BASED_OUTPATIENT_CLINIC_OR_DEPARTMENT_OTHER): Payer: Self-pay | Admitting: Emergency Medicine

## 2018-07-19 DIAGNOSIS — Z7982 Long term (current) use of aspirin: Secondary | ICD-10-CM | POA: Insufficient documentation

## 2018-07-19 DIAGNOSIS — Z7984 Long term (current) use of oral hypoglycemic drugs: Secondary | ICD-10-CM | POA: Insufficient documentation

## 2018-07-19 DIAGNOSIS — M722 Plantar fascial fibromatosis: Secondary | ICD-10-CM

## 2018-07-19 DIAGNOSIS — E119 Type 2 diabetes mellitus without complications: Secondary | ICD-10-CM | POA: Insufficient documentation

## 2018-07-19 DIAGNOSIS — F17210 Nicotine dependence, cigarettes, uncomplicated: Secondary | ICD-10-CM | POA: Insufficient documentation

## 2018-07-19 DIAGNOSIS — I10 Essential (primary) hypertension: Secondary | ICD-10-CM | POA: Insufficient documentation

## 2018-07-19 HISTORY — DX: Obesity, unspecified: E66.9

## 2018-07-19 HISTORY — DX: Calculus of kidney: N20.0

## 2018-07-19 NOTE — ED Provider Notes (Signed)
[facility] EMERGENCY DEPARTMENT Provider Note   CSN: [id number] Arrival date & time: 07/19/18  0915     History   Chief Complaint Chief Complaint  Patient presents with  . Foot Pain    HPI Kristine Bell is a 47 y.o. female.  Patient is a 47 year old female presenting with 3 days of nontraumatic right heel pain.  PMH significant for NIDDM, HTN, nephrolithiasis, obesity.  Patient reports new onset right heel pain which she noticed after getting out of bed to use the bathroom approximately 3 days ago.  She states her pain is constant and is particularly bad when getting out of bed.  She feels that she had this problem in the past but cannot remember.  Her left foot has not been bothersome.  She denies any trauma or stepping on anything.  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  Patient does not have a PCP because she "does not have insurance yet."  She has taken ibuprofen with some relief.  She denies fevers or chills, edema, loss of sensation or motor weakness.     Past Medical History:  Diagnosis Date  . Diabetes mellitus without complication ([place])   . Hypertension   . Kidney stone   . Kidney stones   . Obesity     There are no active problems to display for this patient.   Past Surgical History:  Procedure Laterality Date  . ABLATION ON ENDOMETRIOSIS    . HERNIA REPAIR    . LITHOTRIPSY    . TUBAL LIGATION       OB History   None      Home Medications    Prior to Admission medications   Medication Sig Start Date End Date Taking? Authorizing Provider  aspirin 81 MG chewable tablet Chew by mouth.    [provider]  metFORMIN (GLUCOPHAGE) 500 MG tablet Take by mouth 2 (two) times daily with a meal.    [provider]    Family History No family history on file.  Social History Social History   Tobacco Use  . Smoking status: Current Every Day Smoker    Packs/day: 0.50    Types: Cigarettes  . Smokeless  tobacco: Never Used  Substance Use Topics  . Alcohol use: No  . Drug use: No     Allergies   Naproxen and Tramadol   Review of Systems Review of Systems  Constitutional: Negative for chills and fever.  Respiratory: Negative for cough and shortness of breath.   Cardiovascular: Negative for chest pain and palpitations.  Gastrointestinal: Negative for abdominal pain and vomiting.  Musculoskeletal: Positive for arthralgias and gait problem. Negative for back pain, joint swelling and myalgias.  Skin: Negative for color change and rash.  Neurological: Negative for dizziness and numbness.  All other systems reviewed and are negative.    Physical Exam Updated Vital Signs BP (!) 142/69 (BP Location: Right Arm)   Pulse 64   Temp 98.5 F (36.9 C)   Resp 16   Ht 5\' 5"  (1.651 m)   Wt 129.3 kg   LMP  (Within Months)   SpO2 99%   BMI 47.43 kg/m   Physical Exam  Constitutional: She appears well-developed and well-nourished. No distress.  HENT:  Head: Normocephalic and atraumatic.  Eyes: Conjunctivae are normal.  Neck: Neck supple.  Cardiovascular: Normal rate and regular rhythm.  No murmur heard. Pulmonary/Chest: Effort normal and breath sounds normal. No respiratory distress.  Abdominal:  Soft. There is no tenderness.  Musculoskeletal: She exhibits no edema.  Mild tenderness localized to right heel with negative calcaneal squeeze and absent tenderness to Achilles, passive range of motion intact, patient reluctant to bear weight on right heel  Neurological: She is alert.  Skin: Skin is warm and dry.  Psychiatric: She has a normal mood and affect.  Nursing note and vitals reviewed.    ED Treatments / Results  Labs (all labs ordered are listed, but only abnormal results are displayed) Labs Reviewed - No data to display  EKG None  Radiology No results found.  Procedures Procedures (including critical care time)  Medications Ordered in ED Medications - No data to  display   Initial Impression / Assessment and Plan / ED Course  I have reviewed the triage vital signs and the nursing notes.  Pertinent labs & imaging results that were available during my care of the patient were reviewed by me and considered in my medical decision making (see chart for details).  Patient is a 47 year old female presenting with 3 days of nontraumatic right heel pain.  PMH significant for NIDDM, HTN, nephrolithiasis, obesity.  Patient presenting with new onset right heel pain consistent with plantar fasciitis.  No history of trauma and physical exam reassuring.  Patient is a diabetic but does not recall stepping on anything.  She does have poor foot wear and her occupation is likely contributing to her problem.  Advised patient to establish primary care physician when she obtains insurance and discuss methods to improve her symptoms.  Reviewed return precautions, patient stable for discharge.  Final Clinical Impressions(s) / ED Diagnoses   Final diagnoses:  Plantar fasciitis of right foot    ED Discharge Orders    None       Leigh Bing, DO 07/19/18 5809    Margette Fast, MD 07/19/18 1146

## 2018-07-19 NOTE — ED Notes (Signed)
ED Provider at bedside. 

## 2018-07-19 NOTE — Discharge Instructions (Signed)
You can try rolling a tennis ball or a frozen water bottle on your foot.  The symptoms will improve with time and can take several months to resolve.

## 2018-07-19 NOTE — ED Triage Notes (Signed)
Pain to right heel x3 days. No known injury.  Woke up that way Tuesday morning.

## 2018-09-15 ENCOUNTER — Emergency Department (HOSPITAL_BASED_OUTPATIENT_CLINIC_OR_DEPARTMENT_OTHER): Payer: Self-pay

## 2018-09-15 ENCOUNTER — Emergency Department (HOSPITAL_BASED_OUTPATIENT_CLINIC_OR_DEPARTMENT_OTHER)
Admission: EM | Admit: 2018-09-15 | Discharge: 2018-09-15 | Disposition: A | Discharge status: Home / Self Care | Payer: Self-pay | Attending: Emergency Medicine | Admitting: Emergency Medicine

## 2018-09-15 ENCOUNTER — Encounter (HOSPITAL_BASED_OUTPATIENT_CLINIC_OR_DEPARTMENT_OTHER): Payer: Self-pay | Admitting: Emergency Medicine

## 2018-09-15 ENCOUNTER — Other Ambulatory Visit: Payer: Self-pay

## 2018-09-15 DIAGNOSIS — F17210 Nicotine dependence, cigarettes, uncomplicated: Secondary | ICD-10-CM | POA: Insufficient documentation

## 2018-09-15 DIAGNOSIS — N132 Hydronephrosis with renal and ureteral calculous obstruction: Secondary | ICD-10-CM

## 2018-09-15 DIAGNOSIS — E119 Type 2 diabetes mellitus without complications: Secondary | ICD-10-CM | POA: Insufficient documentation

## 2018-09-15 DIAGNOSIS — Z79899 Other long term (current) drug therapy: Secondary | ICD-10-CM | POA: Insufficient documentation

## 2018-09-15 DIAGNOSIS — Z7982 Long term (current) use of aspirin: Secondary | ICD-10-CM | POA: Insufficient documentation

## 2018-09-15 DIAGNOSIS — N1330 Unspecified hydronephrosis: Secondary | ICD-10-CM | POA: Insufficient documentation

## 2018-09-15 DIAGNOSIS — I159 Secondary hypertension, unspecified: Secondary | ICD-10-CM | POA: Insufficient documentation

## 2018-09-15 DIAGNOSIS — I158 Other secondary hypertension: Secondary | ICD-10-CM

## 2018-09-15 DIAGNOSIS — R05 Cough: Secondary | ICD-10-CM | POA: Insufficient documentation

## 2018-09-15 DIAGNOSIS — R059 Cough, unspecified: Secondary | ICD-10-CM

## 2018-09-15 LAB — CBC WITH DIFFERENTIAL/PLATELET
Abs Immature Granulocytes: 0.04 10*3/uL (ref 0.00–0.07)
Basophils Absolute: 0.1 10*3/uL (ref 0.0–0.1)
Basophils Relative: 1 %
Eosinophils Absolute: 0.1 10*3/uL (ref 0.0–0.5)
Eosinophils Relative: 0 %
HCT: 39.7 % (ref 36.0–46.0)
Hemoglobin: 12.1 g/dL (ref 12.0–15.0)
Immature Granulocytes: 0 %
Lymphocytes Relative: 14 %
Lymphs Abs: 2.1 10*3/uL (ref 0.7–4.0)
MCH: 25.9 pg — AB (ref 26.0–34.0)
MCHC: 30.5 g/dL (ref 30.0–36.0)
MCV: 84.8 fL (ref 80.0–100.0)
MONOS PCT: 7 %
Monocytes Absolute: 1 10*3/uL (ref 0.1–1.0)
Neutro Abs: 11.1 10*3/uL — ABNORMAL HIGH (ref 1.7–7.7)
Neutrophils Relative %: 78 %
Platelets: 280 10*3/uL (ref 150–400)
RBC: 4.68 MIL/uL (ref 3.87–5.11)
RDW: 14.7 % (ref 11.5–15.5)
WBC: 14.4 10*3/uL — ABNORMAL HIGH (ref 4.0–10.5)
nRBC: 0 % (ref 0.0–0.2)

## 2018-09-15 LAB — COMPREHENSIVE METABOLIC PANEL
ALBUMIN: 3.9 g/dL (ref 3.5–5.0)
ALT: 16 U/L (ref 0–44)
AST: 19 U/L (ref 15–41)
Alkaline Phosphatase: 77 U/L (ref 38–126)
Anion gap: 6 (ref 5–15)
BUN: 14 mg/dL (ref 6–20)
CO2: 23 mmol/L (ref 22–32)
Calcium: 10.1 mg/dL (ref 8.9–10.3)
Chloride: 107 mmol/L (ref 98–111)
Creatinine, Ser: 0.7 mg/dL (ref 0.44–1.00)
GFR calc Af Amer: >60 mL/min (ref >60)
GFR calc non Af Amer: >60 mL/min (ref >60)
Glucose, Bld: 148 mg/dL — ABNORMAL HIGH (ref 70–99)
Potassium: 3.8 mmol/L (ref 3.5–5.1)
Sodium: 136 mmol/L (ref 135–145)
Total Bilirubin: 0.5 mg/dL (ref 0.3–1.2)
Total Protein: 7.1 g/dL (ref 6.5–8.1)

## 2018-09-15 LAB — URINALYSIS, MICROSCOPIC (REFLEX)

## 2018-09-15 LAB — URINALYSIS, ROUTINE W REFLEX MICROSCOPIC
Bilirubin Urine: NEGATIVE
Glucose, UA: NEGATIVE mg/dL
Ketones, ur: NEGATIVE mg/dL
LEUKOCYTES UA: NEGATIVE
NITRITE: NEGATIVE
PH: 6 (ref 5.0–8.0)
PROTEIN: NEGATIVE mg/dL
Specific Gravity, Urine: 1.02 (ref 1.005–1.030)

## 2018-09-15 LAB — LIPASE, BLOOD: Lipase: 28 U/L (ref 11–51)

## 2018-09-15 MED ORDER — SODIUM CHLORIDE 0.9 % IV BOLUS
1000.0000 mL | Freq: Once | INTRAVENOUS | Status: AC
  Administered 2018-09-15: 1000 mL via INTRAVENOUS

## 2018-09-15 MED ORDER — FENTANYL CITRATE (PF) 100 MCG/2ML IJ SOLN
75.0000 ug | Freq: Once | INTRAMUSCULAR | Status: AC
  Administered 2018-09-15: 75 ug via INTRAVENOUS
  Filled 2018-09-15: qty 2

## 2018-09-15 MED ORDER — ONDANSETRON HCL 4 MG PO TABS: 4.0000 mg | ORAL_TABLET | Freq: Four times a day (QID) | ORAL | 0 refills | Status: DC

## 2018-09-15 MED ORDER — ONDANSETRON 4 MG PO TBDP: 4.0000 mg | ORAL_TABLET | Freq: Three times a day (TID) | ORAL | 0 refills | Status: AC | PRN

## 2018-09-15 MED ORDER — HYDROCHLOROTHIAZIDE 25 MG PO TABS: 25.0000 mg | ORAL_TABLET | Freq: Every day | ORAL | 0 refills | Status: DC

## 2018-09-15 MED ORDER — ONDANSETRON HCL 4 MG/2ML IJ SOLN
4.0000 mg | Freq: Once | INTRAMUSCULAR | Status: AC
  Administered 2018-09-15: 4 mg via INTRAVENOUS
  Filled 2018-09-15: qty 2

## 2018-09-15 MED ORDER — TAMSULOSIN HCL 0.4 MG PO CAPS: 0.4000 mg | ORAL_CAPSULE | Freq: Every day | ORAL | 0 refills | Status: AC

## 2018-09-15 MED ORDER — KETOROLAC TROMETHAMINE 15 MG/ML IJ SOLN
7.5000 mg | Freq: Once | INTRAMUSCULAR | Status: AC
  Administered 2018-09-15: 7.5 mg via INTRAVENOUS

## 2018-09-15 MED ORDER — TAMSULOSIN HCL 0.4 MG PO CAPS
0.4000 mg | ORAL_CAPSULE | Freq: Once | ORAL | Status: AC
  Administered 2018-09-15: 0.4 mg via ORAL
  Filled 2018-09-15: qty 1

## 2018-09-15 MED ORDER — KETOROLAC TROMETHAMINE 15 MG/ML IJ SOLN
INTRAMUSCULAR | Status: AC
  Filled 2018-09-15: qty 1

## 2018-09-15 MED ORDER — TAMSULOSIN HCL 0.4 MG PO CAPS: 0.4000 mg | ORAL_CAPSULE | Freq: Every day | ORAL | 0 refills | Status: DC

## 2018-09-15 MED ORDER — ALUM & MAG HYDROXIDE-SIMETH 200-200-20 MG/5ML PO SUSP
30.0000 mL | Freq: Once | ORAL | Status: AC
  Administered 2018-09-15: 30 mL via ORAL
  Filled 2018-09-15: qty 30

## 2018-09-15 MED ORDER — KETOROLAC TROMETHAMINE 15 MG/ML IJ SOLN
15.0000 mg | Freq: Once | INTRAMUSCULAR | Status: AC
  Administered 2018-09-15: 15 mg via INTRAVENOUS
  Filled 2018-09-15: qty 1

## 2018-09-15 MED ORDER — HYDROCHLOROTHIAZIDE 25 MG PO TABS
25.0000 mg | ORAL_TABLET | Freq: Once | ORAL | Status: AC
  Administered 2018-09-15: 25 mg via ORAL
  Filled 2018-09-15: qty 1

## 2018-09-15 MED ORDER — IOPAMIDOL (ISOVUE-300) INJECTION 61%
100.0000 mL | Freq: Once | INTRAVENOUS | Status: AC
  Administered 2018-09-15: 100 mL via INTRAVENOUS

## 2018-09-15 NOTE — ED Triage Notes (Addendum)
Pt c/o vomiting, weakness, headache, cough and left sided abd pain.

## 2018-09-15 NOTE — ED Provider Notes (Signed)
[facility] EMERGENCY DEPARTMENT Provider Note  CSN: [id number] Arrival date & time: 09/15/18 0434  Chief Complaint(s) Emesis  HPI Kristine Bell is a 48 y.o. female   The history is provided by the patient.  Flank Pain  This is a recurrent (similar to prior renal stone pain) problem. Episode onset: 6 hrs. The problem occurs constantly. Progression since onset: fluctuating. Associated symptoms include headaches. Pertinent negatives include no chest pain and no shortness of breath. Nothing aggravates the symptoms. Nothing relieves the symptoms. Treatments tried: motrin. The treatment provided no relief.  URI   This is a new problem. Episode onset: 1 week. The problem has not changed since onset.Maximum temperature: subjective. Associated symptoms include congestion, headaches, rhinorrhea and cough. Pertinent negatives include no chest pain.    Past Medical History Past Medical History:  Diagnosis Date  . Diabetes mellitus without complication ([place])   . Hypertension   . Kidney stone   . Kidney stones   . Obesity    There are no active problems to display for this patient.  Home Medication(s) Prior to Admission medications   Medication Sig Start Date End Date Taking? Authorizing Provider  aspirin 81 MG chewable tablet Chew by mouth.    [provider]  hydrochlorothiazide (HYDRODIURIL) 25 MG tablet Take 1 tablet (25 mg total) by mouth daily. 09/15/18 12/14/18  Fatima Blank, MD  metFORMIN (GLUCOPHAGE) 500 MG tablet Take by mouth 2 (two) times daily with a meal.    [provider]  ondansetron (ZOFRAN ODT) 4 MG disintegrating tablet Take 1 tablet (4 mg total) by mouth every 8 (eight) hours as needed for up to 3 days for nausea or vomiting. 09/15/18 09/18/18  Fatima Blank, MD  tamsulosin (FLOMAX) 0.4 MG CAPS capsule Take 1 capsule (0.4 mg total) by mouth daily for 10 days. 09/15/18 09/25/18  Fatima Blank, MD                                                                                                  Past Surgical History Past Surgical History:  Procedure Laterality Date  . ABLATION ON ENDOMETRIOSIS    . HERNIA REPAIR    . LITHOTRIPSY    . TUBAL LIGATION     Family History No family history on file.  Social History Social History   Tobacco Use  . Smoking status: Current Every Day Smoker    Packs/day: 0.50    Types: Cigarettes  . Smokeless tobacco: Never Used  Substance Use Topics  . Alcohol use: No  . Drug use: No   Allergies Naproxen and Tramadol  Review of Systems Review of Systems  HENT: Positive for congestion and rhinorrhea.   Respiratory: Positive for cough. Negative for shortness of breath.   Cardiovascular: Negative for chest pain.  Genitourinary: Positive for flank pain.  Neurological: Positive for headaches.   All other systems are reviewed and are negative for acute change except as noted in the HPI  Physical Exam Vital Signs  I have reviewed the triage vital signs BP (!) 179/86 (BP Location: Left Arm)  Pulse 68   Temp 98.5 F (36.9 C) (Oral)   Resp 18   Ht 5\' 5"  (1.651 m)   Wt (!) 145.2 kg   SpO2 98%   BMI 53.25 kg/m   Physical Exam Vitals signs reviewed.  Constitutional:      General: She is not in acute distress.    Appearance: She is well-developed. She is not diaphoretic.  HENT:     Head: Normocephalic and atraumatic.     Right Ear: Tympanic membrane normal.     Left Ear: Tympanic membrane normal.     Nose: Mucosal edema present.     Right Turbinates: Enlarged.     Left Turbinates: Enlarged.  Eyes:     General: No scleral icterus.       Right eye: No discharge.        Left eye: No discharge.     Conjunctiva/sclera: Conjunctivae normal.     Pupils: Pupils are equal, round, and reactive to light.  Neck:     Musculoskeletal: Normal range of motion and neck supple.  Cardiovascular:     Rate and Rhythm: Normal rate and regular rhythm.       Heart sounds: No murmur. No friction rub. No gallop.   Pulmonary:     Effort: Pulmonary effort is normal. No respiratory distress.     Breath sounds: Normal breath sounds. No stridor. No rales.  Abdominal:     General: There is no distension.     Palpations: Abdomen is soft.     Tenderness: There is abdominal tenderness in the left lower quadrant. There is left CVA tenderness. There is no guarding or rebound.  Musculoskeletal:        General: No tenderness.  Skin:    General: Skin is warm and dry.     Findings: No erythema or rash.  Neurological:     Mental Status: She is alert and oriented to person, place, and time.     ED Results and Treatments Labs (all labs ordered are listed, but only abnormal results are displayed) Labs Reviewed  URINALYSIS, ROUTINE W REFLEX MICROSCOPIC - Abnormal; Notable for the following components:      Result Value   APPearance HAZY (*)    Hgb urine dipstick LARGE (*)    All other components within normal limits  CBC WITH DIFFERENTIAL/PLATELET - Abnormal; Notable for the following components:   WBC 14.4 (*)    MCH 25.9 (*)    Neutro Abs 11.1 (*)    All other components within normal limits  COMPREHENSIVE METABOLIC PANEL - Abnormal; Notable for the following components:   Glucose, Bld 148 (*)    All other components within normal limits  URINALYSIS, MICROSCOPIC (REFLEX) - Abnormal; Notable for the following components:   Bacteria, UA MANY (*)    All other components within normal limits  LIPASE, BLOOD  EKG  EKG Interpretation  Date/Time:    Ventricular Rate:    PR Interval:    QRS Duration:   QT Interval:    QTC Calculation:   R Axis:     Text Interpretation:        Radiology Dg Chest 2 View  Result Date: 09/15/2018 CLINICAL DATA:  Initial evaluation for acute vomiting, weakness, cough. EXAM: CHEST - 2 VIEW  COMPARISON:  Prior radiograph from 09/08/2017. FINDINGS: Cardiomegaly, stable from previous. Mediastinal silhouette within normal limits. Lungs well inflated bilaterally. Mild perihilar vascular congestion without overt pulmonary edema. Streaky left basilar opacity most consistent with subsegmental atelectasis. No focal infiltrates. No pleural effusion. No pneumothorax. Osseous structures within normal limits. IMPRESSION: 1. Cardiomegaly with mild perihilar vascular congestion without overt pulmonary edema. 2. Superimposed mild left basilar subsegmental atelectasis. No other active cardiopulmonary disease. Electronically Signed   By: Jeannine Boga M.D.   On: 09/15/2018 06:35   Ct Abdomen Pelvis W Contrast  Result Date: 09/15/2018 CLINICAL DATA:  Initial evaluation for acute abdominal pain, diverticulitis suspected. EXAM: CT ABDOMEN AND PELVIS WITH CONTRAST TECHNIQUE: Multidetector CT imaging of the abdomen and pelvis was performed using the standard protocol following bolus administration of intravenous contrast. CONTRAST:  144mL ISOVUE-300 IOPAMIDOL (ISOVUE-300) INJECTION 61% COMPARISON:  Prior CT from 02/20/2018. FINDINGS: Lower chest: Minimal hazy subsegmental atelectatic changes noted within the visualized lung bases. Visualized lungs are otherwise clear. Hepatobiliary: Liver demonstrates a normal contrast enhanced appearance. Gallbladder within normal limits. No biliary with dilatation. Pancreas: Pancreas within normal limits. Spleen: Spleen within normal limits. Adrenals/Urinary Tract: Adrenal glands are normal. Asymmetric enlargement of the left kidney with delayed nephrogram. There is an obstructive 5 mm stone position just beyond the UPJ with secondary moderate left hydronephrosis. Prominent left perinephric fat stranding. No other radiopaque calculi seen within the left kidney along the course of the left renal collecting system. On the right, there is a 3 mm nonobstructive stone within the lower  aspect of the right kidney. No other radiopaque calculi seen along the course of the right renal collecting system. No right-sided hydronephrosis or hydroureter. Few scattered subcentimeter hypodensities within the right kidney are too small the characterize, but statistically likely reflects small cyst. No focal enhancing renal mass. Partially distended bladder within normal limits. No layering stones within the bladder lumen. Stomach/Bowel: Small hiatal hernia. Stomach otherwise unremarkable. No evidence for bowel obstruction. Appendix within normal limits. Mild colonic diverticulosis without evidence for acute diverticulitis. No acute inflammatory changes seen about the bowels. Vascular/Lymphatic: Normal intravascular enhancement seen throughout the intra-abdominal aorta. Minimal atherosclerosis for age. No aneurysm. Mesenteric vessels patent proximally. No pathologically enlarged intra-abdominopelvic lymph nodes. Reproductive: Uterus somewhat enlarged and heterogeneous in appearance with suspected uterine fibroids. Ovaries within normal limits. Other: No free air or fluid. Small fat containing ventral hernia noted just to the right of midline at the lower abdomen. Small internal calcification noted. Musculoskeletal: No acute osseous abnormality. No discrete lytic or blastic osseous lesions. IMPRESSION: 1. 5 mm obstructive stone within the proximal left ureter with secondary moderate left hydronephrosis. 2. Additional punctate 3 mm nonobstructive right renal nephrolithiasis. 3. Colonic diverticulosis without evidence for acute diverticulitis. 4. Suspected uterine fibroids. Electronically Signed   By: Jeannine Boga M.D.   On: 09/15/2018 06:52   Pertinent labs & imaging results that were available during my care of the patient were reviewed by me and considered in my medical decision making (see chart for details).  Medications Ordered in ED Medications  ketorolac (  TORADOL) 15 MG/ML injection 7.5 mg  (has no administration in time range)  tamsulosin (FLOMAX) capsule 0.4 mg (has no administration in time range)  hydrochlorothiazide (HYDRODIURIL) tablet 25 mg (has no administration in time range)  sodium chloride 0.9 % bolus 1,000 mL ( Intravenous Stopped 09/15/18 0633)  ondansetron (ZOFRAN) injection 4 mg (4 mg Intravenous Given 09/15/18 0514)  alum & mag hydroxide-simeth (MAALOX/MYLANTA) 200-200-20 MG/5ML suspension 30 mL (30 mLs Oral Given 09/15/18 0515)  ketorolac (TORADOL) 15 MG/ML injection 15 mg (15 mg Intravenous Given 09/15/18 0525)  fentaNYL (SUBLIMAZE) injection 75 mcg (75 mcg Intravenous Given 09/15/18 0636)  iopamidol (ISOVUE-300) 61 % injection 100 mL (100 mLs Intravenous Contrast Given 09/15/18 0615)                                                                                                                                    Procedures Procedures  (including critical care time)  Medical Decision Making / ED Course I have reviewed the nursing notes for this encounter and the patient's prior records (if available in EHR or on provided paperwork).    1. Flank pain Similar to prior renal stone pain, but she is TTP which is abnormal. UA with hematuria and bacteria - but not convincing for UTI.   CBC with leukocytosis. Given the LLQ TTP, diverticulitis was considered. CT abd/pelvis without evidence of diverticulitis or other serious intra-abdominal inflammatory/infectious process.  It did reveal a left 5 mm ureteral stone with moderate hydronephrosis.  Also noted a 3 mm nonobstructing right kidney stone.  Pain improved with Toradol   2. URI AFVSS. Given the leukocytosis, will rule out PNA. CXR negative for pneumonia. Noted to have cardiomegaly with mild vascular congestion. She does not appear to be volume overloaded and does not have DOE or orthopnea concerning for HF at this time. Likely from untreated HTN. Will start HCTZ.  The patient appears reasonably screened and/or stabilized  for discharge and I doubt any other medical condition or other [facility] requiring further screening, evaluation, or treatment in the ED at this time prior to discharge.  The patient is safe for discharge with strict return precautions. goodr  Final Clinical Impression(s) / ED Diagnoses Final diagnoses:  Cough  Hydronephrosis concurrent with and due to calculi of kidney and ureter  Other secondary hypertension    Disposition: Discharge  Condition: Good  I have discussed the results, Dx and Tx plan with the patient who expressed understanding and agree(s) with the plan. Discharge instructions discussed at great length. The patient was given strict return precautions who verbalized understanding of the instructions. No further questions at time of discharge.      Follow Up: Primary care provider  Schedule an appointment as soon as possible for a visit  If you do not have a primary care physician, contact [facility] at [phone] for referral  Alexis Frock, MD [place] [address] [phone]  Schedule an appointment as soon as possible for a visit  For close follow up to assess for kidney stone     This chart was dictated using voice recognition software.  Despite best efforts to proofread,  errors can occur which can change the documentation meaning.   Fatima Blank, MD 09/15/18 [phone]

## 2018-09-15 NOTE — ED Notes (Signed)
Pt requesting pain medication; MD aware.

## 2018-11-26 ENCOUNTER — Other Ambulatory Visit: Payer: Self-pay

## 2018-11-26 ENCOUNTER — Emergency Department (HOSPITAL_BASED_OUTPATIENT_CLINIC_OR_DEPARTMENT_OTHER)
Admission: EM | Admit: 2018-11-26 | Discharge: 2018-11-26 | Disposition: A | Discharge status: Home / Self Care | Payer: Medicaid Other | Attending: Emergency Medicine | Admitting: Emergency Medicine

## 2018-11-26 ENCOUNTER — Encounter (HOSPITAL_BASED_OUTPATIENT_CLINIC_OR_DEPARTMENT_OTHER): Payer: Self-pay

## 2018-11-26 DIAGNOSIS — Z7982 Long term (current) use of aspirin: Secondary | ICD-10-CM | POA: Insufficient documentation

## 2018-11-26 DIAGNOSIS — F17210 Nicotine dependence, cigarettes, uncomplicated: Secondary | ICD-10-CM | POA: Insufficient documentation

## 2018-11-26 DIAGNOSIS — I10 Essential (primary) hypertension: Secondary | ICD-10-CM | POA: Insufficient documentation

## 2018-11-26 DIAGNOSIS — G44209 Tension-type headache, unspecified, not intractable: Secondary | ICD-10-CM

## 2018-11-26 DIAGNOSIS — Z79899 Other long term (current) drug therapy: Secondary | ICD-10-CM | POA: Insufficient documentation

## 2018-11-26 DIAGNOSIS — E119 Type 2 diabetes mellitus without complications: Secondary | ICD-10-CM | POA: Insufficient documentation

## 2018-11-26 MED ORDER — DEXAMETHASONE SODIUM PHOSPHATE 10 MG/ML IJ SOLN
10.0000 mg | Freq: Once | INTRAMUSCULAR | Status: AC
  Administered 2018-11-26: 10 mg via INTRAVENOUS
  Filled 2018-11-26: qty 1

## 2018-11-26 MED ORDER — SODIUM CHLORIDE 0.9 % IV BOLUS
500.0000 mL | Freq: Once | INTRAVENOUS | Status: AC
  Administered 2018-11-26: 500 mL via INTRAVENOUS

## 2018-11-26 MED ORDER — KETOROLAC TROMETHAMINE 15 MG/ML IJ SOLN
15.0000 mg | Freq: Once | INTRAMUSCULAR | Status: AC
  Administered 2018-11-26: 15 mg via INTRAVENOUS
  Filled 2018-11-26: qty 1

## 2018-11-26 MED ORDER — METHOCARBAMOL 500 MG PO TABS: 500.0000 mg | ORAL_TABLET | Freq: Two times a day (BID) | ORAL | 0 refills | Status: DC

## 2018-11-26 MED ORDER — METOCLOPRAMIDE HCL 5 MG/ML IJ SOLN
10.0000 mg | Freq: Once | INTRAMUSCULAR | Status: AC
  Administered 2018-11-26: 10 mg via INTRAVENOUS
  Filled 2018-11-26: qty 2

## 2018-11-26 NOTE — ED Triage Notes (Signed)
Pt c/o headaches for three weeks that has gone unrelieved by occasional ibuprofen

## 2018-11-26 NOTE — Discharge Instructions (Addendum)
Take Robaxin twice daily for muscle pain and spasms.  Do not drive or operate machinery while taking this medication.  You can alternate with ibuprofen and Tylenol extra over-the-counter, as needed for headache.  Use heat to your shoulders alternating 20 minutes on, 20 minutes off.  Continue taking your blood pressure medication as prescribed.  Please follow-up with your primary care provider or neurologist for further evaluation and treatment of your headaches if they are continuing.  Please return to the emergency department if you develop any new or worsening symptoms.

## 2018-11-26 NOTE — ED Provider Notes (Signed)
[facility] EMERGENCY DEPARTMENT Provider Note   CSN: [id number] Arrival date & time: 11/26/18  1507    History   Chief Complaint Chief Complaint  Patient presents with  . Headache    HPI Kristine Bell is a 48 y.o. female with history of hypertension, diabetes, obesity, kidney stones who presents with a 3-week history of intermittent headache.  She reports it has not been keeping her up at night, but starts as she has been awake for little while.  Eating helps a little, however it comes back.  It is gradual onset.  She sometimes has photophobia with it.  Patient has had headaches in the past like this, but they have not lasted this long.  She has no official diagnosis of migraines.  She also reports that her shoulders have been very tight and has had bilateral elbow pain for a while.  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  She denies any nausea, vomiting, chest pain, shortness of breath, neck pain, numbness or tingling.     HPI  Past Medical History:  Diagnosis Date  . Diabetes mellitus without complication ([place])   . Hypertension   . Kidney stone   . Kidney stones   . Obesity     There are no active problems to display for this patient.   Past Surgical History:  Procedure Laterality Date  . ABLATION ON ENDOMETRIOSIS    . HERNIA REPAIR    . LITHOTRIPSY    . TUBAL LIGATION       OB History   No obstetric history on file.      Home Medications    Prior to Admission medications   Medication Sig Start Date End Date Taking? Authorizing Provider  aspirin 81 MG chewable tablet Chew by mouth.   Yes [provider]  hydrochlorothiazide (HYDRODIURIL) 25 MG tablet Take 1 tablet (25 mg total) by mouth daily. 09/15/18 12/14/18 Yes Cardama, Grayce Sessions, MD  metFORMIN (GLUCOPHAGE) 500 MG tablet Take by mouth 2 (two) times daily with a meal.   Yes [provider]  methocarbamol (ROBAXIN) 500 MG tablet Take 1 tablet (500 mg  total) by mouth 2 (two) times daily. 11/26/18   Frederica Kuster, PA-C    Family History No family history on file.  Social History Social History   Tobacco Use  . Smoking status: Current Every Day Smoker    Packs/day: 0.50    Types: Cigarettes  . Smokeless tobacco: Never Used  Substance Use Topics  . Alcohol use: No  . Drug use: No     Allergies   Naproxen and Tramadol   Review of Systems Review of Systems  Constitutional: Negative for chills and fever.  HENT: Negative for facial swelling and sore throat.   Eyes: Positive for photophobia. Negative for visual disturbance.  Respiratory: Negative for shortness of breath.   Cardiovascular: Negative for chest pain.  Gastrointestinal: Negative for abdominal pain, nausea and vomiting.  Genitourinary: Negative for dysuria.  Musculoskeletal: Positive for arthralgias. Negative for back pain.  Skin: Negative for rash and wound.  Neurological: Positive for headaches. Negative for dizziness, light-headedness and numbness.  Psychiatric/Behavioral: The patient is not nervous/anxious.      Physical Exam Updated Vital Signs BP (!) 162/97 (BP Location: Left Arm)   Pulse 70   Temp 98.2 F (36.8 C) (Oral)   Resp 16   Ht 5\' 5"  (1.651 m)   Wt (!) 146.5 kg   LMP 11/19/2018  SpO2 99%   BMI 53.75 kg/m   Physical Exam Vitals signs and nursing note reviewed.  Constitutional:      General: She is not in acute distress.    Appearance: She is well-developed. She is not diaphoretic.  HENT:     Head: Normocephalic and atraumatic.     Mouth/Throat:     Pharynx: No oropharyngeal exudate.  Eyes:     General: No scleral icterus.       Right eye: No discharge.        Left eye: No discharge.     Conjunctiva/sclera: Conjunctivae normal.     Pupils: Pupils are equal, round, and reactive to light.  Neck:     Musculoskeletal: Normal range of motion and neck supple.     Thyroid: No thyromegaly.  Cardiovascular:     Rate and Rhythm:  Normal rate and regular rhythm.     Heart sounds: Normal heart sounds. No murmur. No friction rub. No gallop.   Pulmonary:     Effort: Pulmonary effort is normal. No respiratory distress.     Breath sounds: Normal breath sounds. No stridor. No wheezing or rales.  Abdominal:     General: Bowel sounds are normal. There is no distension.     Palpations: Abdomen is soft.     Tenderness: There is no abdominal tenderness. There is no guarding or rebound.  Lymphadenopathy:     Cervical: No cervical adenopathy.  Skin:    General: Skin is warm and dry.     Coloration: Skin is not pale.     Findings: No rash.  Neurological:     Mental Status: She is alert.     Coordination: Coordination normal.     Comments: CN 3-12 intact; normal sensation throughout; 5/5 strength in all 4 extremities; equal bilateral grip strength; no ataxia on finger-to-nose      ED Treatments / Results  Labs (all labs ordered are listed, but only abnormal results are displayed) Labs Reviewed - No data to display  EKG None  Radiology No results found.  Procedures Procedures (including critical care time)  Medications Ordered in ED Medications  dexamethasone (DECADRON) injection 10 mg (10 mg Intravenous Given 11/26/18 1548)  ketorolac (TORADOL) 15 MG/ML injection 15 mg (15 mg Intravenous Given 11/26/18 1545)  metoCLOPramide (REGLAN) injection 10 mg (10 mg Intravenous Given 11/26/18 1550)  sodium chloride 0.9 % bolus 500 mL (0 mLs Intravenous Stopped 11/26/18 1647)     Initial Impression / Assessment and Plan / ED Course  I have reviewed the triage vital signs and the nursing notes.  Pertinent labs & imaging results that were available during my care of the patient were reviewed by me and considered in my medical decision making (see chart for details).        Patient presenting with headache that has been daily for 3 weeks.  Patient has had headaches like this in the past, however it has not lasted this  long.  Suspect tension headache as patient has pain in her upper trapezius muscles as well as spasm.  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  Suspect arthritis or tendinitis in bilateral elbows.  Offered x-rays, patient declined.  Patient advised to follow-up with PCP and/or neurology.  Will discharge home with Robaxin.  Advised ibuprofen and Tylenol alternating.  Heat and stretching discussed.  Patient advised to continue taking blood pressure medication as prescribed.  Return precautions discussed.  Patient understands  and agrees with plan.  Patient discharged in satisfactory condition.  Final Clinical Impressions(s) / ED Diagnoses   Final diagnoses:  Tension headache    ED Discharge Orders         Ordered    methocarbamol (ROBAXIN) 500 MG tablet  2 times daily     11/26/18 [address], [place], PA-C 11/26/18 1652    Malvin Johns, MD 11/26/18 [phone]

## 2018-11-26 NOTE — ED Notes (Signed)
Pt reports headache for past 2 weeks, has tried OTC headache medications without significant relief.  Denies nausea/vomiting.  Denies visual changes.

## 2019-06-17 ENCOUNTER — Emergency Department (HOSPITAL_BASED_OUTPATIENT_CLINIC_OR_DEPARTMENT_OTHER)
Admission: EM | Admit: 2019-06-17 | Discharge: 2019-06-17 | Disposition: A | Discharge status: Home / Self Care | Payer: Medicaid Other | Attending: Emergency Medicine | Admitting: Emergency Medicine

## 2019-06-17 ENCOUNTER — Other Ambulatory Visit: Payer: Self-pay

## 2019-06-17 ENCOUNTER — Encounter (HOSPITAL_BASED_OUTPATIENT_CLINIC_OR_DEPARTMENT_OTHER): Payer: Self-pay | Admitting: Emergency Medicine

## 2019-06-17 DIAGNOSIS — F17210 Nicotine dependence, cigarettes, uncomplicated: Secondary | ICD-10-CM | POA: Insufficient documentation

## 2019-06-17 DIAGNOSIS — H1012 Acute atopic conjunctivitis, left eye: Secondary | ICD-10-CM | POA: Insufficient documentation

## 2019-06-17 DIAGNOSIS — Z79899 Other long term (current) drug therapy: Secondary | ICD-10-CM | POA: Insufficient documentation

## 2019-06-17 DIAGNOSIS — Z7982 Long term (current) use of aspirin: Secondary | ICD-10-CM | POA: Insufficient documentation

## 2019-06-17 DIAGNOSIS — Z885 Allergy status to narcotic agent status: Secondary | ICD-10-CM | POA: Insufficient documentation

## 2019-06-17 DIAGNOSIS — H1013 Acute atopic conjunctivitis, bilateral: Secondary | ICD-10-CM

## 2019-06-17 DIAGNOSIS — H1011 Acute atopic conjunctivitis, right eye: Secondary | ICD-10-CM | POA: Insufficient documentation

## 2019-06-17 DIAGNOSIS — I10 Essential (primary) hypertension: Secondary | ICD-10-CM | POA: Insufficient documentation

## 2019-06-17 DIAGNOSIS — Z886 Allergy status to analgesic agent status: Secondary | ICD-10-CM | POA: Insufficient documentation

## 2019-06-17 MED ORDER — OLOPATADINE HCL 0.1 % OP SOLN: 1.0000 [drp] | Freq: Two times a day (BID) | OPHTHALMIC | 12 refills | Status: DC

## 2019-06-17 NOTE — ED Triage Notes (Signed)
PT reports bilateral eye redness and itching and drainage for the past few days.

## 2019-06-17 NOTE — ED Provider Notes (Signed)
[place] EMERGENCY DEPARTMENT Provider Note   CSN: [id number] Arrival date & time: 06/17/19  [id number]     History   Chief Complaint Chief Complaint  Patient presents with  . Eye Problem    HPI Kristine Bell is a 48 y.o. female with PMHx HTN, Diabetes who presents to the ED today complaining of gradual onset, constant, itching/discomfort to bilateral eyes x 2-3 days.  Patient endorses watery drainage to the eyes and some mild redness.  States she has a history of seasonal allergies and is supposed to be on a histamines daily but states she has not been taking them due to inability to to forward her medications.  She states she has been off of her antihypertensive and diabetic medications for years.  States when she wakes up in the morning her eyes are swollen and does have some crusting.  Denies fever, chills, ear pain, sore throat, eye pain with movement, any other associated symptoms.      Past Medical History:  Diagnosis Date  . Diabetes mellitus without complication ([place])   . Hypertension   . Kidney stone   . Kidney stones   . Obesity     There are no active problems to display for this patient.   Past Surgical History:  Procedure Laterality Date  . ABLATION ON ENDOMETRIOSIS    . HERNIA REPAIR    . LITHOTRIPSY    . TUBAL LIGATION       OB History   No obstetric history on file.      Home Medications    Prior to Admission medications   Medication Sig Start Date End Date Taking? Authorizing Provider  aspirin 81 MG chewable tablet Chew by mouth.    [provider]  hydrochlorothiazide (HYDRODIURIL) 25 MG tablet Take 1 tablet (25 mg total) by mouth daily. 09/15/18 12/14/18  Fatima Blank, MD  metFORMIN (GLUCOPHAGE) 500 MG tablet Take by mouth 2 (two) times daily with a meal.    [provider]  methocarbamol (ROBAXIN) 500 MG tablet Take 1 tablet (500 mg total) by mouth 2 (two) times daily. 11/26/18   Law, Bea Graff, PA-C   olopatadine (PATANOL) 0.1 % ophthalmic solution Place 1 drop into both eyes 2 (two) times daily. 06/17/19   Eustaquio Maize, PA-C    Family History No family history on file.  Social History Social History   Tobacco Use  . Smoking status: Current Every Day Smoker    Packs/day: 0.50    Types: Cigarettes  . Smokeless tobacco: Never Used  Substance Use Topics  . Alcohol use: No  . Drug use: No     Allergies   Naproxen and Tramadol   Review of Systems Review of Systems  Constitutional: Negative for chills and fever.  HENT: Negative for congestion, dental problem, ear pain and sore throat.   Eyes: Positive for redness and itching. Negative for visual disturbance.     Physical Exam Updated Vital Signs BP (!) 156/87 (BP Location: Right Arm)   Pulse 80   Temp 98.8 F (37.1 C) (Oral)   Resp 18   Ht 5\' 5"  (1.651 m)   Wt (!) 149.7 kg   SpO2 99%   BMI 54.91 kg/m   Physical Exam Vitals signs and nursing note reviewed.  Constitutional:      Appearance: She is obese. She is not ill-appearing.  HENT:     Head: Normocephalic and atraumatic.     Right Ear: Tympanic membrane normal.  Left Ear: Tympanic membrane normal.     Nose: Nose normal.     Mouth/Throat:     Mouth: Mucous membranes are moist.     Pharynx: No oropharyngeal exudate or posterior oropharyngeal erythema.  Eyes:     General: Lids are normal.     Extraocular Movements: Extraocular movements intact.     Comments: Very minimal bilateral conjunctival injection.  No periorbital swelling.  No purulent drainage noted.  Her ocular movements are intact without difficulty.   Cardiovascular:     Rate and Rhythm: Normal rate and regular rhythm.  Pulmonary:     Effort: Pulmonary effort is normal.     Breath sounds: Normal breath sounds.  Skin:    General: Skin is warm and dry.     Coloration: Skin is not jaundiced.  Neurological:     Mental Status: She is alert.      ED Treatments / Results  Labs (all  labs ordered are listed, but only abnormal results are displayed) Labs Reviewed - No data to display  EKG None  Radiology No results found.  Procedures Procedures (including critical care time)  Medications Ordered in ED Medications - No data to display   Initial Impression / Assessment and Plan / ED Course  I have reviewed the triage vital signs and the nursing notes.  Pertinent labs & imaging results that were available during my care of the patient were reviewed by me and considered in my medical decision making (see chart for details).    48 year old female who presents the ED with irritation to both eyes for the past couple of days.  Does have a history of seasonal allergies but is not on medications.  On exam there is some mild injection to the conjunctive a but not overly so.  She has no periorbital swelling.  Her extraocular movements are intact without difficulty.  Amount of watery discharge noted to the eyes.  No purulent discharge.  Exam more consistent with allergic conjunctivitis.  Will treat with olopatadine drops.  Advised patient that she needs to take a histamine daily as well.  She reports that she has been out of her anti-hypertensive and diabetic medication for years due to not having a PCP and lack of good insurance.  She states she is trying to get into contact with the community health and wellness center at [facility] without success as they are too busy.  I will give her contact information for [facility] in [place].  Her blood pressure is mildly elevated at 156/87 today.   This note was prepared using Dragon voice recognition software and may include unintentional dictation errors due to the inherent limitations of voice recognition software.       Final Clinical Impressions(s) / ED Diagnoses   Final diagnoses:  Allergic conjunctivitis of both eyes    ED Discharge Orders         Ordered    olopatadine (PATANOL) 0.1 %  ophthalmic solution  2 times daily     06/17/19 [address], PA-C 06/17/19 1015    Virgel Manifold, MD 06/17/19 1157

## 2019-06-17 NOTE — ED Notes (Signed)
Pt verbalized understanding of dc instructions.

## 2019-06-17 NOTE — Discharge Instructions (Signed)
Please use eye drops as prescribed until your symptoms resolve I would recommend taking an antihistamine daily as well - these can be found OTC  Please follow up with [facility] for your primary care needs

## 2019-07-15 ENCOUNTER — Emergency Department (HOSPITAL_BASED_OUTPATIENT_CLINIC_OR_DEPARTMENT_OTHER): Payer: Self-pay

## 2019-07-15 ENCOUNTER — Emergency Department (HOSPITAL_BASED_OUTPATIENT_CLINIC_OR_DEPARTMENT_OTHER)
Admission: EM | Admit: 2019-07-15 | Discharge: 2019-07-15 | Disposition: A | Discharge status: Home / Self Care | Payer: Self-pay | Attending: Emergency Medicine | Admitting: Emergency Medicine

## 2019-07-15 ENCOUNTER — Encounter (HOSPITAL_BASED_OUTPATIENT_CLINIC_OR_DEPARTMENT_OTHER): Payer: Self-pay | Admitting: Emergency Medicine

## 2019-07-15 ENCOUNTER — Other Ambulatory Visit: Payer: Self-pay

## 2019-07-15 DIAGNOSIS — Z885 Allergy status to narcotic agent status: Secondary | ICD-10-CM | POA: Insufficient documentation

## 2019-07-15 DIAGNOSIS — I10 Essential (primary) hypertension: Secondary | ICD-10-CM | POA: Insufficient documentation

## 2019-07-15 DIAGNOSIS — E119 Type 2 diabetes mellitus without complications: Secondary | ICD-10-CM | POA: Insufficient documentation

## 2019-07-15 DIAGNOSIS — Z886 Allergy status to analgesic agent status: Secondary | ICD-10-CM | POA: Insufficient documentation

## 2019-07-15 DIAGNOSIS — F17210 Nicotine dependence, cigarettes, uncomplicated: Secondary | ICD-10-CM | POA: Insufficient documentation

## 2019-07-15 DIAGNOSIS — M25511 Pain in right shoulder: Secondary | ICD-10-CM | POA: Insufficient documentation

## 2019-07-15 DIAGNOSIS — N3000 Acute cystitis without hematuria: Secondary | ICD-10-CM | POA: Insufficient documentation

## 2019-07-15 LAB — URINALYSIS, ROUTINE W REFLEX MICROSCOPIC
Bilirubin Urine: NEGATIVE
Glucose, UA: NEGATIVE mg/dL
Ketones, ur: NEGATIVE mg/dL
Leukocytes,Ua: NEGATIVE
Nitrite: POSITIVE — AB
Protein, ur: NEGATIVE mg/dL
Specific Gravity, Urine: 1.025 (ref 1.005–1.030)
pH: 6 (ref 5.0–8.0)

## 2019-07-15 LAB — URINALYSIS, MICROSCOPIC (REFLEX): RBC / HPF: >50 RBC/hpf (ref 0–5)

## 2019-07-15 MED ORDER — HYDROCHLOROTHIAZIDE 25 MG PO TABS: 25.0000 mg | ORAL_TABLET | Freq: Every day | ORAL | 0 refills | Status: DC

## 2019-07-15 MED ORDER — DICLOFENAC SODIUM 1 % TD GEL: 2.0000 g | Freq: Four times a day (QID) | TRANSDERMAL | 0 refills | Status: DC | PRN

## 2019-07-15 MED ORDER — CEPHALEXIN 500 MG PO CAPS: 500.0000 mg | ORAL_CAPSULE | Freq: Two times a day (BID) | ORAL | 0 refills | Status: AC

## 2019-07-15 MED FILL — CEPHALEXIN 500 MG CAPSULE: 500 | 7 days supply | Qty: 14 | Fill #0

## 2019-07-15 MED FILL — HYDROCHLOROTHIAZIDE 25 MG T: 25 | 30 days supply | Qty: 30 | Fill #0

## 2019-07-15 MED FILL — DICLOFENAC SODIUM 1 % GEL: 1 | 13 days supply | Qty: 100 | Fill #0

## 2019-07-15 NOTE — ED Provider Notes (Signed)
Emergency Department Provider Note   I have reviewed the triage vital signs and the nursing notes.   HISTORY  Chief Complaint  Multiple complaints   HPI Kristine Bell is a 48 y.o. female presents to the ED with UTI symptoms and right arm/shoulder pain. 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 She notes pain with raising the arm. No neck pain. Denies weakness/numbness in the arm/hand. No injury. No fever.   Patient also reports foul smelling urine with mild low back pain over the last several days. She reports taking cranberry pills OTC in the last several days without relief. She notes a history of kidney stones but states her back pain is mild and feels nothing like kidney stones. No fever or shaking chills. No vomiting or diarrhea.    Past Medical History:  Diagnosis Date  . Diabetes mellitus without complication ([place])   . Hypertension   . Kidney stone   . Kidney stones   . Obesity     There are no active problems to display for this patient.   Past Surgical History:  Procedure Laterality Date  . ABLATION ON ENDOMETRIOSIS    . HERNIA REPAIR    . LITHOTRIPSY    . TUBAL LIGATION      Allergies Naproxen and Tramadol  No family history on file.  Social History Social History   Tobacco Use  . Smoking status: Current Every Day Smoker    Packs/day: 0.50    Types: Cigarettes  . Smokeless tobacco: Never Used  Substance Use Topics  . Alcohol use: No  . Drug use: No    Review of Systems  Constitutional: No fever/chills Eyes: No visual changes. ENT: No sore throat. Cardiovascular: Denies chest pain. Respiratory: Denies shortness of breath. Gastrointestinal: No abdominal pain. No nausea, no vomiting.  No diarrhea.  No constipation. Genitourinary: Positive for dysuria. Musculoskeletal: Positive for back pain. Positive right shoulder pain.  Skin: Negative for rash. Neurological: Negative  for headaches, focal weakness or numbness.  10-point ROS otherwise negative.  ____________________________________________   PHYSICAL EXAM:  VITAL SIGNS: ED Triage Vitals  Enc Vitals Group     BP 07/15/19 0946 (!) 174/90     Pulse Rate 07/15/19 0946 69     Resp 07/15/19 0946 20     Temp 07/15/19 0946 97.6 F (36.4 C)     Temp Source 07/15/19 0946 Oral     SpO2 07/15/19 0946 100 %     Weight 07/15/19 0947 (!) 324 lb (147 kg)     Height 07/15/19 0947 5\' 5"  (1.651 m)   Constitutional: Alert and oriented. Well appearing and in no acute distress. Eyes: Conjunctivae are normal.  Head: Atraumatic. Nose: No congestion/rhinnorhea. Mouth/Throat: Mucous membranes are moist.  Neck: No stridor. No cervical spine tenderness to palpation. Cardiovascular: Normal rate, regular rhythm. Good peripheral circulation. Grossly normal heart sounds.   Respiratory: Normal respiratory effort.  No retractions. Lungs CTAB. Gastrointestinal: Soft and nontender. No distention. No CVA tenderness.  Musculoskeletal: No lower extremity tenderness nor edema. No gross deformities of extremities. Pain with active ROM of the shoulder shoulder at 90 degrees and beyond. Normal ROM of the right elbow and wrist.  Neurologic:  Normal speech and language. No gross focal neurologic deficits are appreciated.  Skin:  Skin is warm, dry and intact. No rash noted.   ____________________________________________   LABS (all labs ordered are listed, but only abnormal results  are displayed)  Labs Reviewed  URINALYSIS, ROUTINE W REFLEX MICROSCOPIC - Abnormal; Notable for the following components:      Result Value   APPearance CLOUDY (*)    Hgb urine dipstick LARGE (*)    Nitrite POSITIVE (*)    All other components within normal limits  URINALYSIS, MICROSCOPIC (REFLEX) - Abnormal; Notable for the following components:   Bacteria, UA MANY (*)    All other components within normal limits    ____________________________________________  RADIOLOGY  Dg Shoulder Right  Result Date: 07/15/2019 CLINICAL DATA:  Chronic right shoulder pain for years. No known injury. EXAM: RIGHT SHOULDER - 2+ VIEW COMPARISON:  None. FINDINGS: There is no evidence of acute fracture or dislocation. The subacromial space appears widened on the AP view with inferior subluxation of the humeral head relative to the glenoid, a finding which can be seen with a joint effusion or ligamentous laxity. There is no dislocation. Mild acromioclavicular degenerative changes are present. There is a probable unfused os acromiale. IMPRESSION: 1. No acute osseous findings. 2. Inferior subluxation of the humeral head relative to the glenoid. This finding can be seen with joint effusion or ligamentous laxity. Electronically Signed   By: Richardean Sale M.D.   On: 07/15/2019 10:39    ____________________________________________   PROCEDURES  Procedure(s) performed:   Procedures  None ____________________________________________   INITIAL IMPRESSION / ASSESSMENT AND PLAN / ED COURSE  Pertinent labs & imaging results that were available during my care of the patient were reviewed by me and considered in my medical decision making (see chart for details).   Patient presents to the ED with right shoulder pain and UTI symptoms. No clinical concern for pyelo. Patient does have UTI on UA. Pain not typical of kidney stones for the patient. Non-toxic appearing. No sepsis concern. Plan for Keflex and PCP follow up.   Patient with right shoulder pain x 3 weeks. Plain film with inferior subluxation noted. No clinical concern for septic joint. Suspect pain 2/2 joint laxity. No fracture. Plan for ROM exercises and sports med f/u.   Patient also requesting refill of BP meds which was provided. PCP contact information also provided.    ____________________________________________  FINAL CLINICAL IMPRESSION(S) / ED DIAGNOSES   Final diagnoses:  Acute cystitis without hematuria  Acute pain of right shoulder     NEW OUTPATIENT MEDICATIONS STARTED DURING THIS VISIT:  Discharge Medication List as of 07/15/2019 10:59 AM    START taking these medications   Details  cephALEXin (KEFLEX) 500 MG capsule Take 1 capsule (500 mg total) by mouth 2 (two) times daily for 7 days., Starting Mon 07/15/2019, Until Mon 07/22/2019, Normal    diclofenac sodium (VOLTAREN) 1 % GEL Apply 2 g topically 4 (four) times daily as needed., Starting Mon 07/15/2019, Normal        Note:  This document was prepared using Dragon voice recognition software and may include unintentional dictation errors.  Nanda Quinton, MD, [facility]    Long, Wonda Olds, MD 07/15/19 [phone]

## 2019-07-15 NOTE — ED Triage Notes (Signed)
Pt reports right arm x 3 weeks, sts lifts heavy bags at work. Reports shooting pain from right elbow to shoulder, no recent  Or known injury.  Also reports right lower back , smelly  Urine, Hx UTI and kidney stone yet dont feel like a kidney stone today, pt reports.  Also reports no PCP and out of HTN meds. Alert and oriented x 4

## 2019-07-15 NOTE — Discharge Instructions (Signed)
You were seen in the emergency department today with right shoulder pain and was found to have a urine infection.  Please call the sports medicine doctor listed to schedule a follow-up appointment.  Take the antibiotic as directed for your urine infection.  Return to the emergency department any new or suddenly worsening symptoms.

## 2019-07-16 ENCOUNTER — Encounter: Payer: Self-pay | Admitting: Family Medicine

## 2019-07-16 ENCOUNTER — Other Ambulatory Visit: Payer: Self-pay

## 2019-07-16 ENCOUNTER — Ambulatory Visit (INDEPENDENT_AMBULATORY_CARE_PROVIDER_SITE_OTHER): Payer: Self-pay | Admitting: Family Medicine

## 2019-07-16 ENCOUNTER — Ambulatory Visit: Payer: Self-pay

## 2019-07-16 VITALS — BP 125/79 | HR 73 | Ht 65.0 in | Wt 324.0 lb

## 2019-07-16 DIAGNOSIS — M7501 Adhesive capsulitis of right shoulder: Secondary | ICD-10-CM | POA: Insufficient documentation

## 2019-07-16 DIAGNOSIS — M25511 Pain in right shoulder: Secondary | ICD-10-CM

## 2019-07-16 MED ORDER — METHYLPREDNISOLONE ACETATE 40 MG/ML IJ SUSP
40.0000 mg | Freq: Once | INTRAMUSCULAR | Status: AC
  Administered 2019-07-16: 40 mg via INTRA_ARTICULAR

## 2019-07-16 NOTE — Addendum Note (Signed)
Addended by: Sherrie George F on: 07/16/2019 11:41 AM   Modules accepted: Orders

## 2019-07-16 NOTE — Progress Notes (Signed)
Kristine Bell - 48 y.o. female MRN [id number]  Date of birth: 08-26-1971  SUBJECTIVE:  Including CC & ROS.  Chief Complaint  Patient presents with  . Shoulder Pain    right shoulder x 3 weeks    Kristine Bell is a 48 y.o. female that is presenting with acute right shoulder pain.  The pain is been ongoing for about 3 weeks.  She has pain with lying on the affected side.  Denies any specific inciting event.  Pain is worse with repetitive motions or reaching overhead.  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  No improvement with medications to date.  Pain is intermittent in nature.  Pain can be moderate to severe.  It can be sharp and stabbing.  No prior history of shoulder surgery or neck surgery.  Independent review of the right shoulder x-ray from 11/2 shows no acute abnormality.   Review of Systems  Constitutional: Negative for fever.  HENT: Negative for congestion.   Respiratory: Negative for cough.   Cardiovascular: Negative for chest pain.  Gastrointestinal: Negative for abdominal pain.  Musculoskeletal: Positive for arthralgias.  Skin: Negative for color change.  Neurological: Negative for weakness.  Hematological: Negative for adenopathy.    HISTORY: Past Medical, Surgical, Social, and Family History Reviewed & Updated per EMR.   Pertinent Historical Findings include:  Past Medical History:  Diagnosis Date  . Diabetes mellitus without complication ([place])   . Hypertension   . Kidney stone   . Kidney stones   . Obesity     Past Surgical History:  Procedure Laterality Date  . ABLATION ON ENDOMETRIOSIS    . HERNIA REPAIR    . LITHOTRIPSY    . TUBAL LIGATION      Allergies  Allergen Reactions  . Naproxen   . Tramadol     No family history on file.   Social History   Socioeconomic History  . Marital status: Single    Spouse name: Not on file  . Number of children: Not on file  . Years of education: Not on file  . Highest education level:  Not on file  Occupational History  . Not on file  Social Needs  . Financial resource strain: Not on file  . Food insecurity    Worry: Not on file    Inability: Not on file  . Transportation needs    Medical: Not on file    Non-medical: Not on file  Tobacco Use  . Smoking status: Current Every Day Smoker    Packs/day: 0.50    Types: Cigarettes  . Smokeless tobacco: Never Used  Substance and Sexual Activity  . Alcohol use: No  . Drug use: No  . Sexual activity: Not on file  Lifestyle  . Physical activity    Days per week: Not on file    Minutes per session: Not on file  . Stress: Not on file  Relationships  . Social Herbalist on phone: Not on file    Gets together: Not on file    Attends religious service: Not on file    Active member of club or organization: Not on file    Attends meetings of clubs or organizations: Not on file    Relationship status: Not on file  . Intimate partner violence    Fear of current or ex partner: Not on file    Emotionally abused: Not on file    Physically abused: Not on file  Forced sexual activity: Not on file  Other Topics Concern  . Not on file  Social History Narrative  . Not on file     PHYSICAL EXAM:  VS: BP 125/79   Pulse 73   Ht 5\' 5"  (1.651 m)   Wt (!) 324 lb (147 kg)   BMI 53.92 kg/m  Physical Exam Gen: NAD, alert, cooperative with exam, well-appearing ENT: normal lips, normal nasal mucosa,  Eye: normal EOM, normal conjunctiva and lids CV:  no edema, +2 pedal pulses   Resp: no accessory muscle use, non-labored,  Skin: no rashes, no areas of induration  Neuro: normal tone, normal sensation to touch Psych:  normal insight, alert and oriented MSK:  Right shoulder: Normal active flexion and abduction. Normal internal and external rotation. Pain with external rotation and abduction. Positive empty can test and Hawkins test. Negative O'Brien's test. Normal speeds test. Neurovascularly intact  Limited  ultrasound: Right shoulder:  Biceps tendon appears to be normal in long and short axis. Normal subscapularis. Supraspinatus appears to be normal.  There is a hyperechoic thickening superior to the supraspinatus.  Unclear if this is a bursal change. There appears to be an os acromiale Mild degenerative changes of the [facility] joint. No effusion appreciated the posterior glenohumeral joint  Summary: Findings suggest rotator cuff changes as the source of her pain.  Ultrasound and interpretation by Clearance Coots, MD    Aspiration/Injection Procedure Note Rox Talmadge [birth year]  Procedure: Injection Indications: Right shoulder pain  Procedure Details Consent: Risks of procedure as well as the alternatives and risks of each were explained to the (patient/caregiver).  Consent for procedure obtained. Time Out: Verified patient identification, verified procedure, site/side was marked, verified correct patient position, special equipment/implants available, medications/allergies/relevent history reviewed, required imaging and test results available.  Performed.  The area was cleaned with iodine and alcohol swabs.    The right subacromial space was injected using 1 cc's of 40 mg Depo-Medrol and 4 cc's of 0.25% bupivacaine with a 22 1 1/2" needle.  Ultrasound was used. Images were obtained in long views showing the injection.     A sterile dressing was applied.  Patient did tolerate procedure well.      ASSESSMENT & PLAN:   Acute pain of right shoulder Has suggestions of capsular as well as rotator cuff on exam. Ultrasound showed a thickening just superficial to the supraspinatus. Unclear if this is a bursal change.  -Subacromial injection. -Counseled on home exercise therapy and supportive care. -Could consider glenohumeral joint injection versus physical therapy. -Provided work note

## 2019-07-16 NOTE — Patient Instructions (Signed)
Nice to meet you Please try the exercises  Please try ice   Please send me a message in MyChart with any questions or updates.  Please see me back in 4 weeks.   --Dr. Raeford Razor

## 2019-07-16 NOTE — Assessment & Plan Note (Signed)
Has suggestions of capsular as well as rotator cuff on exam. Ultrasound showed a thickening just superficial to the supraspinatus. Unclear if this is a bursal change.  -Subacromial injection. -Counseled on home exercise therapy and supportive care. -Could consider glenohumeral joint injection versus physical therapy. -Provided work note

## 2019-08-13 ENCOUNTER — Ambulatory Visit: Payer: Medicaid Other | Admitting: Family Medicine

## 2019-08-13 NOTE — Progress Notes (Deleted)
  Kristine Bell - 48 y.o. female MRN [id number]  Date of birth: 17-Dec-1970  SUBJECTIVE:  Including CC & ROS.  No chief complaint on file.   Kristine Bell is a 48 y.o. female that is  ***.  ***   Review of Systems  HISTORY: Past Medical, Surgical, Social, and Family History Reviewed & Updated per EMR.   Pertinent Historical Findings include:  Past Medical History:  Diagnosis Date  . Diabetes mellitus without complication ([place])   . Hypertension   . Kidney stone   . Kidney stones   . Obesity     Past Surgical History:  Procedure Laterality Date  . ABLATION ON ENDOMETRIOSIS    . HERNIA REPAIR    . LITHOTRIPSY    . TUBAL LIGATION      Allergies  Allergen Reactions  . Naproxen   . Tramadol     No family history on file.   Social History   Socioeconomic History  . Marital status: Single    Spouse name: Not on file  . Number of children: Not on file  . Years of education: Not on file  . Highest education level: Not on file  Occupational History  . Not on file  Social Needs  . Financial resource strain: Not on file  . Food insecurity    Worry: Not on file    Inability: Not on file  . Transportation needs    Medical: Not on file    Non-medical: Not on file  Tobacco Use  . Smoking status: Current Every Day Smoker    Packs/day: 0.50    Types: Cigarettes  . Smokeless tobacco: Never Used  Substance and Sexual Activity  . Alcohol use: No  . Drug use: No  . Sexual activity: Not on file  Lifestyle  . Physical activity    Days per week: Not on file    Minutes per session: Not on file  . Stress: Not on file  Relationships  . Social Herbalist on phone: Not on file    Gets together: Not on file    Attends religious service: Not on file    Active member of club or organization: Not on file    Attends meetings of clubs or organizations: Not on file    Relationship status: Not on file  . Intimate partner violence    Fear of current or ex partner: Not on  file    Emotionally abused: Not on file    Physically abused: Not on file    Forced sexual activity: Not on file  Other Topics Concern  . Not on file  Social History Narrative  . Not on file     PHYSICAL EXAM:  VS: There were no vitals taken for this visit. Physical Exam Gen: NAD, alert, cooperative with exam, well-appearing ENT: normal lips, normal nasal mucosa,  Eye: normal EOM, normal conjunctiva and lids CV:  no edema, +2 pedal pulses   Resp: no accessory muscle use, non-labored,  GI: no masses or tenderness, no hernia  Skin: no rashes, no areas of induration  Neuro: normal tone, normal sensation to touch Psych:  normal insight, alert and oriented MSK:  ***      ASSESSMENT & PLAN:   No problem-specific Assessment & Plan notes found for this encounter.

## 2019-09-25 ENCOUNTER — Ambulatory Visit (INDEPENDENT_AMBULATORY_CARE_PROVIDER_SITE_OTHER): Payer: Self-pay | Admitting: Medical

## 2019-09-25 ENCOUNTER — Ambulatory Visit (INDEPENDENT_AMBULATORY_CARE_PROVIDER_SITE_OTHER): Payer: Self-pay | Admitting: Family Medicine

## 2019-09-25 ENCOUNTER — Encounter: Payer: Self-pay | Admitting: Family Medicine

## 2019-09-25 ENCOUNTER — Ambulatory Visit: Payer: Self-pay

## 2019-09-25 ENCOUNTER — Encounter: Payer: Self-pay | Admitting: Medical

## 2019-09-25 ENCOUNTER — Other Ambulatory Visit: Payer: Self-pay

## 2019-09-25 VITALS — BP 176/117 | HR 85 | Ht 65.0 in | Wt 320.0 lb

## 2019-09-25 VITALS — BP 160/90 | HR 78 | Temp 98.1°F | Resp 12 | Ht 65.0 in | Wt 357.8 lb

## 2019-09-25 DIAGNOSIS — E119 Type 2 diabetes mellitus without complications: Secondary | ICD-10-CM

## 2019-09-25 DIAGNOSIS — F17200 Nicotine dependence, unspecified, uncomplicated: Secondary | ICD-10-CM

## 2019-09-25 DIAGNOSIS — I10 Essential (primary) hypertension: Secondary | ICD-10-CM

## 2019-09-25 DIAGNOSIS — E785 Hyperlipidemia, unspecified: Secondary | ICD-10-CM

## 2019-09-25 DIAGNOSIS — M7501 Adhesive capsulitis of right shoulder: Secondary | ICD-10-CM

## 2019-09-25 MED ORDER — METFORMIN HCL 500 MG PO TABS: 500.0000 mg | ORAL_TABLET | Freq: Two times a day (BID) | ORAL | 3 refills | Status: DC

## 2019-09-25 MED ORDER — CYCLOBENZAPRINE HCL 5 MG PO TABS: 5.0000 mg | ORAL_TABLET | Freq: Two times a day (BID) | ORAL | 0 refills | Status: DC | PRN

## 2019-09-25 MED ORDER — TRIAMCINOLONE ACETONIDE 40 MG/ML IJ SUSP
40.0000 mg | Freq: Once | INTRAMUSCULAR | Status: AC
  Administered 2019-09-25: 10:00:00 40 mg via INTRA_ARTICULAR

## 2019-09-25 MED ORDER — ATORVASTATIN CALCIUM 10 MG PO TABS: 10.0000 mg | ORAL_TABLET | Freq: Every day | ORAL | 3 refills | Status: DC

## 2019-09-25 MED ORDER — LOSARTAN POTASSIUM 50 MG PO TABS: 50.0000 mg | ORAL_TABLET | Freq: Every day | ORAL | 3 refills | Status: DC

## 2019-09-25 MED ORDER — CHLORTHALIDONE 25 MG PO TABS: 25.0000 mg | ORAL_TABLET | Freq: Every day | ORAL | 3 refills | Status: DC

## 2019-09-25 NOTE — Progress Notes (Signed)
Kristine Bell - 49 y.o. female MRN [id number]  Date of birth: 1971/02/25  SUBJECTIVE:  Including CC & ROS.  Chief Complaint  Patient presents with  . Follow-up    follow up for right shoulder    Kristine Bell is a 49 y.o. female that is presenting with acute worsening of the right shoulder pain.  She received a subacromial injection on 11/3 and had no improvement of the pain.  The pain is more constant and severe.  Pain is worse with any abduction movement.  Pain is sharp and severe.  No radicular symptoms.  She does have radiation to the elbow.   Review of Systems See HPI   HISTORY: Past Medical, Surgical, Social, and Family History Reviewed & Updated per EMR.   Pertinent Historical Findings include:  Past Medical History:  Diagnosis Date  . Diabetes mellitus without complication ([place])   . Hypertension   . Kidney stone   . Kidney stones   . Obesity     Past Surgical History:  Procedure Laterality Date  . ABLATION ON ENDOMETRIOSIS    . HERNIA REPAIR    . LITHOTRIPSY    . TUBAL LIGATION      Allergies  Allergen Reactions  . Naproxen   . Tramadol     No family history on file.   Social History   Socioeconomic History  . Marital status: Single    Spouse name: Not on file  . Number of children: Not on file  . Years of education: Not on file  . Highest education level: Not on file  Occupational History  . Not on file  Tobacco Use  . Smoking status: Current Every Day Smoker    Packs/day: 0.50    Types: Cigarettes  . Smokeless tobacco: Never Used  Substance and Sexual Activity  . Alcohol use: No  . Drug use: No  . Sexual activity: Not on file  Other Topics Concern  . Not on file  Social History Narrative  . Not on file   Social Determinants of Health   Financial Resource Strain:   . Difficulty of Paying Living Expenses: Not on file  Food Insecurity:   . Worried About Charity fundraiser in the Last Year: Not on file  . Ran Out of Food in the Last Year:  Not on file  Transportation Needs:   . Lack of Transportation (Medical): Not on file  . Lack of Transportation (Non-Medical): Not on file  Physical Activity:   . Days of Exercise per Week: Not on file  . Minutes of Exercise per Session: Not on file  Stress:   . Feeling of Stress : Not on file  Social Connections:   . Frequency of Communication with Friends and Family: Not on file  . Frequency of Social Gatherings with Friends and Family: Not on file  . Attends Religious Services: Not on file  . Active Member of Clubs or Organizations: Not on file  . Attends Archivist Meetings: Not on file  . Marital Status: Not on file  Intimate Partner Violence:   . Fear of Current or Ex-Partner: Not on file  . Emotionally Abused: Not on file  . Physically Abused: Not on file  . Sexually Abused: Not on file     PHYSICAL EXAM:  VS: BP (!) 176/117   Pulse 85   Ht 5\' 5"  (1.651 m)   Wt (!) 320 lb (145.2 kg)   BMI 53.25 kg/m  Physical Exam Gen: NAD,  alert, cooperative with exam, well-appearing ENT: normal lips, normal nasal mucosa,  Eye: normal EOM, normal conjunctiva and lids Skin: no rashes, no areas of induration  Neuro: normal tone, normal sensation to touch Psych:  normal insight, alert and oriented MSK:  Right shoulder: Limited passive abduction. Limited external rotation compared to the contralateral side. Pain with empty can testing. Pain with internal and external rotation with abduction. Neurovascularly intact   Aspiration/Injection Procedure Note Kristine Bell 12-Aug-1971  Procedure: Injection Indications: Right shoulder pain  Procedure Details Consent: Risks of procedure as well as the alternatives and risks of each were explained to the (patient/caregiver).  Consent for procedure obtained. Time Out: Verified patient identification, verified procedure, site/side was marked, verified correct patient position, special equipment/implants available,  medications/allergies/relevent history reviewed, required imaging and test results available.  Performed.  The area was cleaned with iodine and alcohol swabs.    The right glenohumeral joint was injected using 1 cc's of 40 mg Kenalog, 3 cc of 1% lidocaine, and 3 cc's of 0.25% bupivacaine with a 22 3 1/2" needle.  Ultrasound was used. Images were obtained in short views showing the injection.     A sterile dressing was applied.  Patient did tolerate procedure well.     ASSESSMENT & PLAN:   Adhesive capsulitis of right shoulder Pain seems more capsular in nature today.  Has been ongoing since September.  More likely capsulitis. -Glenohumeral injection. -Provided Flexeril. -Counseled on home x-rays therapy and supportive care. -Could consider physical therapy or Toradol intra-articular injection.

## 2019-09-25 NOTE — Progress Notes (Signed)
Subjective:    Patient ID: Kristine Bell, female    DOB: 1971-06-21, 49 y.o.   MRN: [id number]  HPI  Pt in for first time.   Pt state no recent pcp for years. She was over at sports medicine today for shoulder injection.  At sports med today her bp was 176/117. When checked earlier it was checked on her wrist/forearm. Today when our staff checked it was on upper arm with machine.  Pt states she never checks her bp. Pt has been getting ha on and off since she ran out of medication she was given hctz 25 mg daily but I don't see that in the note. Pt got 30 days supply and ran out about 5 weeks ago. Since she ran out she has had ha every day. But no gross motor or sensory function deficits.  Pt also has diabetes. Pt used to be on metformin.  Pt is not fasting presently. Hx of high cholesterol. Prior use of atorvastatin. Pt states   Pt has hx of gerd. She states some daily.  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 Pt does walk some from work. Pt does smoke cigarettes. Pack a week presently. Pt used to smoke more in that past. Smoked for 25 years. Pt estimates in past 7 cigarettes a day. Pt does not drink alcohol. Single- 5 children. 9 grandchildren.    Review of Systems  Constitutional: Negative for chills and fatigue.  HENT: Negative for congestion and ear pain.   Respiratory: Negative for chest tightness, shortness of breath and wheezing.   Cardiovascular: Negative for chest pain and palpitations.  Gastrointestinal: Negative for abdominal pain, diarrhea and vomiting.  Genitourinary: Negative for difficulty urinating, dyspareunia and dysuria.  Musculoskeletal: Negative for back pain and joint swelling.  Skin: Negative for rash.  Neurological: Positive for headaches. Negative for dizziness, syncope, speech difficulty, weakness and numbness.  Hematological: Negative for adenopathy. Does not bruise/bleed easily.  Psychiatric/Behavioral: Negative for  behavioral problems and confusion. The patient is not nervous/anxious.    Past Medical History:  Diagnosis Date  . Diabetes mellitus without complication ([place])   . GERD (gastroesophageal reflux disease)   . Hyperlipidemia   . Hypertension   . Kidney stone   . Kidney stones   . Obesity      Social History   Socioeconomic History  . Marital status: Single    Spouse name: Not on file  . Number of children: Not on file  . Years of education: Not on file  . Highest education level: Not on file  Occupational History  . Not on file  Tobacco Use  . Smoking status: Current Every Day Smoker    Packs/day: 0.50    Types: Cigarettes  . Smokeless tobacco: Never Used  Substance and Sexual Activity  . Alcohol use: No  . Drug use: No  . Sexual activity: Not on file  Other Topics Concern  . Not on file  Social History Narrative  . Not on file   Social Determinants of Health   Financial Resource Strain:   . Difficulty of Paying Living Expenses: Not on file  Food Insecurity:   . Worried About Charity fundraiser in the Last Year: Not on file  . Ran Out of Food in the Last Year: Not on file  Transportation Needs:   . Lack of Transportation (Medical): Not on file  . Lack of Transportation (Non-Medical): Not on file  Physical Activity:   .  Days of Exercise per Week: Not on file  . Minutes of Exercise per Session: Not on file  Stress:   . Feeling of Stress : Not on file  Social Connections:   . Frequency of Communication with Friends and Family: Not on file  . Frequency of Social Gatherings with Friends and Family: Not on file  . Attends Religious Services: Not on file  . Active Member of Clubs or Organizations: Not on file  . Attends Archivist Meetings: Not on file  . Marital Status: Not on file  Intimate Partner Violence:   . Fear of Current or Ex-Partner: Not on file  . Emotionally Abused: Not on file  . Physically Abused: Not on file  . Sexually Abused: Not on  file    Past Surgical History:  Procedure Laterality Date  . ABLATION ON ENDOMETRIOSIS    . HERNIA REPAIR    . LITHOTRIPSY    . TUBAL LIGATION      Family History  Problem Relation Age of Onset  . Hyperlipidemia Father   . Hypertension Father   . Hypertension Brother   . Cervical cancer Maternal Grandmother   . Crohn's disease Brother     Allergies  Allergen Reactions  . Naproxen Sodium Hives  . Naproxen   . Sertraline     Other reaction(s): EXACERBATE DEPRESSION  . Tramadol Nausea Only    Current Outpatient Medications on File Prior to Visit  Medication Sig Dispense Refill  . aspirin 81 MG chewable tablet Chew by mouth.    . cyclobenzaprine (FLEXERIL) 5 MG tablet Take 1 tablet (5 mg total) by mouth 2 (two) times daily as needed for muscle spasms. 30 tablet 0  . hydrochlorothiazide (HYDRODIURIL) 25 MG tablet Take 1 tablet (25 mg total) by mouth daily. 30 tablet 0  . diclofenac sodium (VOLTAREN) 1 % GEL Apply 2 g topically 4 (four) times daily as needed. (Patient not taking: Reported on 09/25/2019) 100 g 0  . metFORMIN (GLUCOPHAGE) 500 MG tablet Take by mouth 2 (two) times daily with a meal.     No current facility-administered medications on file prior to visit.    BP (!) 152/56 (BP Location: Left Arm, Cuff Size: Large)   Pulse 78   Temp 98.1 F (36.7 C) (Temporal)   Resp 12   Ht 5\' 5"  (1.651 m)   Wt (!) 357 lb 12.8 oz (162.3 kg)   SpO2 99%   BMI 59.54 kg/m       Objective:   Physical Exam  General Mental Status- Alert. General Appearance- Not in acute distress.   Skin General: Color- Normal Color. Moisture- Normal Moisture.  Neck Carotid Arteries- Normal color. Moisture- Normal Moisture. No carotid bruits. No JVD.  Chest and Lung Exam Auscultation: Breath Sounds:-Normal.  Cardiovascular Auscultation:Rythm- Regular. Murmurs & Other Heart Sounds:Auscultation of the heart reveals- No Murmurs.  Abdomen Inspection:-Inspeection Normal.  Palpation/Percussion:Note:No mass. Palpation and Percussion of the abdomen reveal- Non Tender, Non Distended + BS, no rebound or guarding.    Neurologic Cranial Nerve exam:- CN III-XII intact(No nystagmus), symmetric smile. Strength:- 5/5 equal and symmetric strength both upper and lower extremities.      Assessment & Plan:  For htn moderate to severe but better than at sport med, I rx'chlorthalidone 25 mg daily and losartan 50 mg daily. If you have any cardiac or neurologic type signs and symptoms with recent severe high bp then ED evaluation.  For diabetes start back on metformin.  For high cholesterol  hx start back on atorvastatin 10 mg daily.  Future labs placed today. Please get scheduled for fasting labs. See list of labs.   Please stop smoking as you have numerous cardiovascular risk factors. When bp better can rx wellbutrin to help you quit.  Follow up in 2 weeks for bp or as needed.  Recommend getting otc bp cuff.  30 minutes spent with pt. 50% of time spent counseling pt on plan going forward.  Mackie Pai, PA-C

## 2019-09-25 NOTE — Patient Instructions (Signed)
For htn moderate to severe but better than at sport med, I rx'chlorthalidone 25 mg daily and losartan 50 mg daily. If you have any cardiac or neurologic type signs and symptoms with recent severe high bp then ED evaluation.  For diabetes start back on metformin.  For high cholesterol hx start back on atorvastatin 10 mg daily.  Future labs placed today. Please get scheduled for fasting labs. See list of labs.   Please stop smoking as you have numerous cardiovascular risk factors. When bp better can rx wellbutrin to help you quit.  Follow up in 2 weeks for bp or as needed.  Recommend getting otc bp cuff.

## 2019-09-25 NOTE — Assessment & Plan Note (Addendum)
Pain seems more capsular in nature today.  Has been ongoing since September.  More likely capsulitis. -Glenohumeral injection. -Provided Flexeril. -Counseled on home x-rays therapy and supportive care. -Could consider physical therapy or Toradol intra-articular injection.

## 2019-09-25 NOTE — Patient Instructions (Signed)
Good to see you Happy early birthday  Please try ice  Please try the exercises  The muscle relaxer can make you sleepy  Please send me a message in MyChart with any questions or updates.  Please see me back in 4 weeks.   --Dr. Raeford Razor

## 2019-10-02 ENCOUNTER — Other Ambulatory Visit (INDEPENDENT_AMBULATORY_CARE_PROVIDER_SITE_OTHER): Payer: Self-pay

## 2019-10-02 ENCOUNTER — Other Ambulatory Visit: Payer: Self-pay

## 2019-10-02 DIAGNOSIS — E119 Type 2 diabetes mellitus without complications: Secondary | ICD-10-CM

## 2019-10-02 DIAGNOSIS — E785 Hyperlipidemia, unspecified: Secondary | ICD-10-CM

## 2019-10-02 DIAGNOSIS — I10 Essential (primary) hypertension: Secondary | ICD-10-CM

## 2019-10-02 LAB — COMPREHENSIVE METABOLIC PANEL
ALT: 12 U/L (ref 0–35)
AST: 10 U/L (ref 0–37)
Albumin: 4 g/dL (ref 3.5–5.2)
Alkaline Phosphatase: 121 U/L — ABNORMAL HIGH (ref 39–117)
BUN: 22 mg/dL (ref 6–23)
CO2: 32 mEq/L (ref 19–32)
Calcium: 12.1 mg/dL — ABNORMAL HIGH (ref 8.4–10.5)
Chloride: 102 mEq/L (ref 96–112)
Creatinine, Ser: 0.72 mg/dL (ref 0.40–1.20)
GFR: 104.61 mL/min (ref >60.00)
Glucose, Bld: 142 mg/dL — ABNORMAL HIGH (ref 70–99)
Potassium: 4.2 mEq/L (ref 3.5–5.1)
Sodium: 138 mEq/L (ref 135–145)
Total Bilirubin: 0.5 mg/dL (ref 0.2–1.2)
Total Protein: 6.9 g/dL (ref 6.0–8.3)

## 2019-10-02 LAB — LIPID PANEL
Cholesterol: 172 mg/dL (ref 0–200)
HDL: 42.7 mg/dL (ref >39.00)
LDL Cholesterol: 115 mg/dL — ABNORMAL HIGH (ref 0–99)
NonHDL: 128.97
Total CHOL/HDL Ratio: 4
Triglycerides: 72 mg/dL (ref 0.0–149.0)
VLDL: 14.4 mg/dL (ref 0.0–40.0)

## 2019-10-02 LAB — HEMOGLOBIN A1C: Hgb A1c MFr Bld: 6.7 % — ABNORMAL HIGH (ref 4.6–6.5)

## 2019-10-03 ENCOUNTER — Telehealth: Payer: Self-pay | Admitting: Medical

## 2019-10-03 NOTE — Telephone Encounter (Signed)
PTH future order placed.

## 2019-10-08 ENCOUNTER — Other Ambulatory Visit: Payer: Self-pay

## 2019-10-09 ENCOUNTER — Other Ambulatory Visit: Payer: Self-pay

## 2019-10-09 ENCOUNTER — Ambulatory Visit (INDEPENDENT_AMBULATORY_CARE_PROVIDER_SITE_OTHER): Payer: Self-pay | Admitting: Medical

## 2019-10-09 ENCOUNTER — Encounter: Payer: Self-pay | Admitting: Medical

## 2019-10-09 VITALS — BP 126/84 | HR 69 | Temp 97.4°F | Resp 18 | Ht 65.0 in | Wt 356.0 lb

## 2019-10-09 DIAGNOSIS — E119 Type 2 diabetes mellitus without complications: Secondary | ICD-10-CM

## 2019-10-09 DIAGNOSIS — H538 Other visual disturbances: Secondary | ICD-10-CM

## 2019-10-09 DIAGNOSIS — F17200 Nicotine dependence, unspecified, uncomplicated: Secondary | ICD-10-CM

## 2019-10-09 DIAGNOSIS — I10 Essential (primary) hypertension: Secondary | ICD-10-CM

## 2019-10-09 DIAGNOSIS — Z113 Encounter for screening for infections with a predominantly sexual mode of transmission: Secondary | ICD-10-CM

## 2019-10-09 DIAGNOSIS — E785 Hyperlipidemia, unspecified: Secondary | ICD-10-CM

## 2019-10-09 DIAGNOSIS — Z124 Encounter for screening for malignant neoplasm of cervix: Secondary | ICD-10-CM

## 2019-10-09 MED ORDER — BUPROPION HCL ER (XL) 150 MG PO TB24: ORAL_TABLET | ORAL | 2 refills | Status: DC

## 2019-10-09 MED ORDER — TRAZODONE HCL 50 MG PO TABS: 25.0000 mg | ORAL_TABLET | Freq: Every evening | ORAL | 3 refills | Status: DC | PRN

## 2019-10-09 NOTE — Progress Notes (Signed)
Subjective:    Patient ID: Kristine Bell, female    DOB: [birth year], 49 y.o.   MRN: [id number]  HPI  Pt in for follow up.  Pt has htn. Pt is on  Chorthalidone and losartan. Losartan added on last visit. BP came down a lot. No cardiac or neurologic signs/symptoms.  Pt has diabetes. She is on metformin.  Pt has high cholesterol and is on atorvastatin.  Pt is smoker as well. Discussed with pt and will rx wellbutrin.      Review of Systems  Constitutional: Negative for chills, fatigue and fever.  HENT: Negative for congestion and drooling.   Respiratory: Negative for cough, chest tightness, shortness of breath and wheezing.   Cardiovascular: Negative for chest pain and palpitations.  Gastrointestinal: Negative for abdominal pain, diarrhea, nausea and vomiting.  Musculoskeletal: Negative for back pain.  Skin: Negative for rash.  Neurological: Negative for dizziness, syncope, weakness and light-headedness.  Hematological: Negative for adenopathy. Does not bruise/bleed easily.  Psychiatric/Behavioral: Negative for behavioral problems and confusion.   Past Medical History:  Diagnosis Date  . Diabetes mellitus without complication ([place])   . GERD (gastroesophageal reflux disease)   . Hyperlipidemia   . Hypertension   . Kidney stone   . Kidney stones   . Obesity      Social History   Socioeconomic History  . Marital status: Single    Spouse name: Not on file  . Number of children: Not on file  . Years of education: Not on file  . Highest education level: Not on file  Occupational History  . Not on file  Tobacco Use  . Smoking status: Current Every Day Smoker    Packs/day: 0.50    Types: Cigarettes  . Smokeless tobacco: Never Used  Substance and Sexual Activity  . Alcohol use: No  . Drug use: No  . Sexual activity: Not on file  Other Topics Concern  . Not on file  Social History Narrative  . Not on file   Social Determinants of Health   Financial Resource  Strain:   . Difficulty of Paying Living Expenses: Not on file  Food Insecurity:   . Worried About Charity fundraiser in the Last Year: Not on file  . Ran Out of Food in the Last Year: Not on file  Transportation Needs:   . Lack of Transportation (Medical): Not on file  . Lack of Transportation (Non-Medical): Not on file  Physical Activity:   . Days of Exercise per Week: Not on file  . Minutes of Exercise per Session: Not on file  Stress:   . Feeling of Stress : Not on file  Social Connections:   . Frequency of Communication with Friends and Family: Not on file  . Frequency of Social Gatherings with Friends and Family: Not on file  . Attends Religious Services: Not on file  . Active Member of Clubs or Organizations: Not on file  . Attends Archivist Meetings: Not on file  . Marital Status: Not on file  Intimate Partner Violence:   . Fear of Current or Ex-Partner: Not on file  . Emotionally Abused: Not on file  . Physically Abused: Not on file  . Sexually Abused: Not on file    Past Surgical History:  Procedure Laterality Date  . ABLATION ON ENDOMETRIOSIS    . HERNIA REPAIR    . LITHOTRIPSY    . TUBAL LIGATION      Family History  Problem Relation  Age of Onset  . Hyperlipidemia Father   . Hypertension Father   . Hypertension Brother   . Cervical cancer Maternal Grandmother   . Crohn's disease Brother     Allergies  Allergen Reactions  . Naproxen Sodium Hives  . Naproxen   . Sertraline     Other reaction(s): EXACERBATE DEPRESSION  . Tramadol Nausea Only    Current Outpatient Medications on File Prior to Visit  Medication Sig Dispense Refill  . aspirin 81 MG chewable tablet Chew by mouth.    Marland Kitchen atorvastatin (LIPITOR) 10 MG tablet Take 1 tablet (10 mg total) by mouth daily. 30 tablet 3  . chlorthalidone (HYGROTON) 25 MG tablet Take 1 tablet (25 mg total) by mouth daily. 30 tablet 3  . cyclobenzaprine (FLEXERIL) 5 MG tablet Take 1 tablet (5 mg total) by  mouth 2 (two) times daily as needed for muscle spasms. 30 tablet 0  . diclofenac sodium (VOLTAREN) 1 % GEL Apply 2 g topically 4 (four) times daily as needed. 100 g 0  . losartan (COZAAR) 50 MG tablet Take 1 tablet (50 mg total) by mouth daily. 30 tablet 3  . metFORMIN (GLUCOPHAGE) 500 MG tablet Take 1 tablet (500 mg total) by mouth 2 (two) times daily with a meal. 60 tablet 3   No current facility-administered medications on file prior to visit.    BP 126/84 (BP Location: Left Arm, Patient Position: Sitting, Cuff Size: Large)   Pulse 69   Temp (!) 97.4 F (36.3 C) (Temporal)   Resp 18   Ht 5\' 5"  (1.651 m)   Wt (!) 356 lb (161.5 kg)   SpO2 96%   BMI 59.24 kg/m       Objective:   Physical Exam  General- No acute distress. Pleasant patient. Neck- Full range of motion, no jvd Lungs- Clear, even and unlabored. Heart- regular rate and rhythm. Neurologic- CNII- XII grossly intact.       Assessment & Plan:  Htn very well controlled today. Continue current medication regimen.  For diabetes continue metformin and low sugar diet.  For high cholesterol continue atorvastatin.  For smoking cessation rx wellbutrin. Rx advisement given.  Follow up in 3 months or as needed  For insomnia rx trazadone.  30 minutes spent with pt. 50% of time counseling on plan going forward.    Mackie Pai, PA-C

## 2019-10-09 NOTE — Patient Instructions (Addendum)
Htn very well controlled today. Continue current medication regimen.  For diabetes continue metformin and low sugar diet.  For high cholesterol continue atorvastatin.  For smoking cessation rx wellbutrin. Rx advisement given.  For insomnia rx trazadone.  Follow up in 3 months or as needed

## 2019-10-10 LAB — HIV ANTIBODY (ROUTINE TESTING W REFLEX): HIV 1&2 Ab, 4th Generation: NONREACTIVE

## 2019-10-23 ENCOUNTER — Ambulatory Visit: Payer: Self-pay | Admitting: Family Medicine

## 2019-10-23 NOTE — Progress Notes (Deleted)
  Kristine Bell - 49 y.o. female MRN [id number]  Date of birth: [birth year]  SUBJECTIVE:  Including CC & ROS.  No chief complaint on file.   Kristine Bell is a 49 y.o. female that is  ***.  ***   Review of Systems See HPI   HISTORY: Past Medical, Surgical, Social, and Family History Reviewed & Updated per EMR.   Pertinent Historical Findings include:  Past Medical History:  Diagnosis Date  . Diabetes mellitus without complication ([place])   . GERD (gastroesophageal reflux disease)   . Hyperlipidemia   . Hypertension   . Kidney stone   . Kidney stones   . Obesity     Past Surgical History:  Procedure Laterality Date  . ABLATION ON ENDOMETRIOSIS    . HERNIA REPAIR    . LITHOTRIPSY    . TUBAL LIGATION      Family History  Problem Relation Age of Onset  . Hyperlipidemia Father   . Hypertension Father   . Hypertension Brother   . Cervical cancer Maternal Grandmother   . Crohn's disease Brother     Social History   Socioeconomic History  . Marital status: Single    Spouse name: Not on file  . Number of children: Not on file  . Years of education: Not on file  . Highest education level: Not on file  Occupational History  . Not on file  Tobacco Use  . Smoking status: Current Every Day Smoker    Packs/day: 0.50    Types: Cigarettes  . Smokeless tobacco: Never Used  Substance and Sexual Activity  . Alcohol use: No  . Drug use: No  . Sexual activity: Not on file  Other Topics Concern  . Not on file  Social History Narrative  . Not on file   Social Determinants of Health   Financial Resource Strain:   . Difficulty of Paying Living Expenses: Not on file  Food Insecurity:   . Worried About Charity fundraiser in the Last Year: Not on file  . Ran Out of Food in the Last Year: Not on file  Transportation Needs:   . Lack of Transportation (Medical): Not on file  . Lack of Transportation (Non-Medical): Not on file  Physical Activity:   . Days of Exercise per Week:  Not on file  . Minutes of Exercise per Session: Not on file  Stress:   . Feeling of Stress : Not on file  Social Connections:   . Frequency of Communication with Friends and Family: Not on file  . Frequency of Social Gatherings with Friends and Family: Not on file  . Attends Religious Services: Not on file  . Active Member of Clubs or Organizations: Not on file  . Attends Archivist Meetings: Not on file  . Marital Status: Not on file  Intimate Partner Violence:   . Fear of Current or Ex-Partner: Not on file  . Emotionally Abused: Not on file  . Physically Abused: Not on file  . Sexually Abused: Not on file     PHYSICAL EXAM:  VS: There were no vitals taken for this visit. Physical Exam Gen: NAD, alert, cooperative with exam, well-appearing MSK:  ***      ASSESSMENT & PLAN:   No problem-specific Assessment & Plan notes found for this encounter.

## 2019-11-26 ENCOUNTER — Ambulatory Visit (INDEPENDENT_AMBULATORY_CARE_PROVIDER_SITE_OTHER): Payer: Medicaid Other | Admitting: Medical

## 2019-11-26 VITALS — BP 133/60 | HR 72 | Temp 97.8°F | Resp 18 | Ht 65.0 in | Wt 265.0 lb

## 2019-11-26 DIAGNOSIS — R519 Headache, unspecified: Secondary | ICD-10-CM

## 2019-11-26 DIAGNOSIS — M545 Low back pain, unspecified: Secondary | ICD-10-CM

## 2019-11-26 MED ORDER — CYCLOBENZAPRINE HCL 10 MG PO TABS: 10.0000 mg | ORAL_TABLET | Freq: Every day | ORAL | 0 refills | Status: DC

## 2019-11-26 NOTE — Patient Instructions (Addendum)
Alleve tonight with one flexeril tab before sleep.  Will see you tomorrow morning 840 to check your bp and determine if able to give toradol 30 mg or 60 mg dose.  After exam tomorrow determine if med such as imitrex needed for migraine and determine if might need brief course of narcotic for back pain.  Will ammend this note and just charge for one visit. But need in office to get bp and give potential injection.  Follow up tomorrow am.    Update today on 11/27/2019. Pt bp is better. Flexeril and alleve helped some. Pt states pain level came down to level 4/10. Pt states ha did not improve any. No gross motor or sensory function deficits.   Will give toradol 60 mg im today. Continue alleve to start tomorrow. Continue flexeril at night. Rx imitrex to use if ha resolves but then come back with light sensitive features.  Also will get lumbar spine xray today.  New follow up one week.

## 2019-11-26 NOTE — Progress Notes (Addendum)
   Subjective:    Patient ID: Kristine Bell, female    DOB: 05/27/1971, 49 y.o.   MRN: [id number]  HPI Virtual Visit via Video Note  I connected with Kristine Bell on 11/26/19 at  4:20 PM EDT by a video enabled telemedicine application and verified that I am speaking with the correct person using two identifiers.  Location: Patient: work Provider: office   I discussed the limitations of evaluation and management by telemedicine and the availability of in person appointments. The patient expressed understanding and agreed to proceed.    HPI_ Pt in with some lower back pain. Patient states back pain when stands for long time or walks. Pain present for bout week- 10 days. 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 She is taking advil or alleve. After work the pain calms down when resting with heating pad. Pt does not work this weekend.  Pain 9/10 with activity but no radicular pain to legs and no weakness.   Pt also has recent headache for about one week. She states some light sensitive ha(no gross motor or sensory function deficits.). 4 days ago when lasted checked bp was 120/90. She is taking her bp medications. Pt has taken med for migraine ha in the past.   Pt does not have bp at work. But her boyfriend has a cuff.  Objective. General-no acute distress, pleasant, oriented. Lungs- on inspection lungs appear unlabored. Neck- no tracheal deviation or jvd on inspection. Neuro- gross motor function appears intact. In office 11-28-2019. eom intact. Symmetric smile. No hand drift. Finger to nose intact.   A/P  Alleve tonight with one flexeril tab before sleep.  Will see you tomorrow morning 840 to check your bp and determine if able to give toradol 30 mg or 60 mg dose.  After exam tomorrow determine if med such as imitrex needed for migrain and determine if might need brief course of narcotic for back pain.  Will ammend this note and just charge for one visit. But need in office  to get bp and give potential injection.  Follow up tomorrow am.  Mackie Pai, PA-C   Follow Up Instructions:    I discussed the assessment and treatment plan with the patient. The patient was provided an opportunity to ask questions and all were answered. The patient agreed with the plan and demonstrated an understanding of the instructions.   The patient was advised to call back or seek an in-person evaluation if the symptoms worsen or if the condition fails to improve as anticipated.  I provided 30  minutes of non-face-to-face time during this encounter.   Mackie Pai, PA-C  Update today on 11/27/2019. Pt bp is better. Flexeril and alleve helped some. Pt states pain level came down to level 4/10. Pt states ha did not improve any. No gross motor or sensory function deficits.   Will give toradol 60 mg im today. Continue alleve to start tomorrow. Continue flexeril at night. Rx imitrex to use if ha resolves but then come back with light sensitive features.  Also will get lumbar spine xray today.  New follow up one week.    Review of Systems     Objective:   Physical Exam        Assessment & Plan:

## 2019-11-27 ENCOUNTER — Other Ambulatory Visit: Payer: Self-pay

## 2019-11-27 ENCOUNTER — Ambulatory Visit (HOSPITAL_BASED_OUTPATIENT_CLINIC_OR_DEPARTMENT_OTHER)
Admission: RE | Admit: 2019-11-27 | Discharge: 2019-11-27 | Disposition: A | Discharge status: Home / Self Care | Payer: Self-pay | Source: Ambulatory Visit | Attending: Medical | Admitting: Medical

## 2019-11-27 ENCOUNTER — Ambulatory Visit (INDEPENDENT_AMBULATORY_CARE_PROVIDER_SITE_OTHER): Payer: Self-pay | Admitting: Medical

## 2019-11-27 DIAGNOSIS — M545 Low back pain, unspecified: Secondary | ICD-10-CM

## 2019-11-27 MED ORDER — KETOROLAC TROMETHAMINE 60 MG/2ML IM SOLN
60.0000 mg | Freq: Once | INTRAMUSCULAR | Status: AC
  Administered 2019-11-27: 09:00:00 60 mg via INTRAMUSCULAR

## 2019-11-27 MED ORDER — SUMATRIPTAN SUCCINATE 50 MG PO TABS: 50.0000 mg | ORAL_TABLET | ORAL | 0 refills | Status: DC | PRN

## 2019-11-27 NOTE — Addendum Note (Signed)
Addended by: Jeronimo Greaves on: 11/27/2019 09:04 AM   Modules accepted: Orders

## 2019-11-27 NOTE — Progress Notes (Signed)
No visit today. Saw yesterday and ammended. Note. No nurse visit available. Today but needed to be seen.

## 2019-11-27 NOTE — Addendum Note (Signed)
Addended by: Anabel Halon on: 11/27/2019 08:53 AM   Modules accepted: Orders

## 2019-12-02 ENCOUNTER — Other Ambulatory Visit: Payer: Self-pay

## 2019-12-03 ENCOUNTER — Other Ambulatory Visit: Payer: Self-pay

## 2019-12-03 ENCOUNTER — Ambulatory Visit (INDEPENDENT_AMBULATORY_CARE_PROVIDER_SITE_OTHER): Payer: Self-pay | Admitting: Medical

## 2019-12-03 VITALS — BP 134/57 | HR 73 | Temp 97.9°F | Resp 18 | Ht 65.0 in | Wt 364.0 lb

## 2019-12-03 DIAGNOSIS — M545 Low back pain, unspecified: Secondary | ICD-10-CM

## 2019-12-03 MED ORDER — METHYLPREDNISOLONE 4 MG PO TABS: ORAL_TABLET | ORAL | 0 refills | Status: DC

## 2019-12-03 NOTE — Progress Notes (Signed)
Subjective:    Patient ID: Kristine Bell, female    DOB: 08/16/1971, 49 y.o.   MRN: [id number]  HPI  Pt in for follow up.  She still has low back pain. Only has gotten minimally better. Pain level now 7/10 with activity. See last note hpi below.  Pt in with some lower back pain. Patient states back pain when stands for long time or walks. Pain present for bout week- 10 days. 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 She is taking advil or alleve. After work the pain calms down when resting with heating pad. Pt does not work this weekend.  Pain 9/10 with activity but no radicular pain to legs and no weakness.    Pt got toradol injection day after last virtual visit.   Pt also used flexeril and alleve.  Med options limited to some degree based on fact diabetes and has history of allergy to naproxen per chart. So unclear if that is case as she had been on even before she saw me.      Review of Systems  Constitutional: Negative for chills, fatigue and fever.  Respiratory: Negative for cough, chest tightness, shortness of breath and wheezing.   Cardiovascular: Negative for chest pain and palpitations.  Gastrointestinal: Negative for abdominal pain.  Musculoskeletal: Positive for back pain.  Skin: Negative for rash.  Neurological: Negative for dizziness, speech difficulty, weakness and headaches.  Psychiatric/Behavioral: Negative for decreased concentration and sleep disturbance. The patient is not nervous/anxious.     Past Medical History:  Diagnosis Date  . Diabetes mellitus without complication ([place])   . GERD (gastroesophageal reflux disease)   . Hyperlipidemia   . Hypertension   . Kidney stone   . Kidney stones   . Obesity      Social History   Socioeconomic History  . Marital status: Single    Spouse name: Not on file  . Number of children: Not on file  . Years of education: Not on file  . Highest education level: Not on file  Occupational History  . Not  on file  Tobacco Use  . Smoking status: Current Every Day Smoker    Packs/day: 0.50    Types: Cigarettes  . Smokeless tobacco: Never Used  Substance and Sexual Activity  . Alcohol use: No  . Drug use: No  . Sexual activity: Not on file  Other Topics Concern  . Not on file  Social History Narrative  . Not on file   Social Determinants of Health   Financial Resource Strain:   . Difficulty of Paying Living Expenses:   Food Insecurity:   . Worried About Charity fundraiser in the Last Year:   . Arboriculturist in the Last Year:   Transportation Needs:   . Film/video editor (Medical):   Marland Kitchen Lack of Transportation (Non-Medical):   Physical Activity:   . Days of Exercise per Week:   . Minutes of Exercise per Session:   Stress:   . Feeling of Stress :   Social Connections:   . Frequency of Communication with Friends and Family:   . Frequency of Social Gatherings with Friends and Family:   . Attends Religious Services:   . Active Member of Clubs or Organizations:   . Attends Archivist Meetings:   Marland Kitchen Marital Status:   Intimate Partner Violence:   . Fear of Current or Ex-Partner:   . Emotionally Abused:   Marland Kitchen Physically  Abused:   . Sexually Abused:     Past Surgical History:  Procedure Laterality Date  . ABLATION ON ENDOMETRIOSIS    . HERNIA REPAIR    . LITHOTRIPSY    . TUBAL LIGATION      Family History  Problem Relation Age of Onset  . Hyperlipidemia Father   . Hypertension Father   . Hypertension Brother   . Cervical cancer Maternal Grandmother   . Crohn's disease Brother     Allergies  Allergen Reactions  . Naproxen Sodium Hives  . Naproxen   . Sertraline     Other reaction(s): EXACERBATE DEPRESSION  . Tramadol Nausea Only    Current Outpatient Medications on File Prior to Visit  Medication Sig Dispense Refill  . aspirin 81 MG chewable tablet Chew by mouth.    Marland Kitchen atorvastatin (LIPITOR) 10 MG tablet Take 1 tablet (10 mg total) by mouth daily.  30 tablet 3  . buPROPion (WELLBUTRIN XL) 150 MG 24 hr tablet 1 tab po q day 30 tablet 2  . chlorthalidone (HYGROTON) 25 MG tablet Take 1 tablet (25 mg total) by mouth daily. 30 tablet 3  . cyclobenzaprine (FLEXERIL) 10 MG tablet Take 1 tablet (10 mg total) by mouth at bedtime. 7 tablet 0  . diclofenac sodium (VOLTAREN) 1 % GEL Apply 2 g topically 4 (four) times daily as needed. 100 g 0  . losartan (COZAAR) 50 MG tablet Take 1 tablet (50 mg total) by mouth daily. 30 tablet 3  . metFORMIN (GLUCOPHAGE) 500 MG tablet Take 1 tablet (500 mg total) by mouth 2 (two) times daily with a meal. 60 tablet 3  . SUMAtriptan (IMITREX) 50 MG tablet Take 1 tablet (50 mg total) by mouth every 2 (two) hours as needed for migraine. May repeat in 2 hours if headache persists or recurs. 10 tablet 0  . traZODone (DESYREL) 50 MG tablet Take 0.5-1 tablets (25-50 mg total) by mouth at bedtime as needed for sleep. 30 tablet 3   No current facility-administered medications on file prior to visit.    BP (!) 134/57 (BP Location: Left Arm, Patient Position: Sitting, Cuff Size: Large)   Pulse 73   Temp 97.9 F (36.6 C) (Temporal)   Resp 18   Ht 5\' 5"  (1.651 m)   Wt (!) 364 lb (165.1 kg)   SpO2 98%   BMI 60.57 kg/m       Objective:   Physical Exam  General Appearance- Not in acute distress.    Chest and Lung Exam Auscultation: Breath sounds:-Normal. Clear even and unlabored. Adventitious sounds:- No Adventitious sounds.  Cardiovascular Auscultation:Rythm - Regular, rate and rythm. Heart Sounds -Normal heart sounds.  Abdomen Inspection:-Inspection Normal.  Palpation/Perucssion: Palpation and Percussion of the abdomen reveal- Non Tender, No Rebound tenderness, No rigidity(Guarding) and No Palpable abdominal masses.  Liver:-Normal.  Spleen:- Normal.   Back Mid lumbar spine tenderness to palpation.   Lower ext neurologic  L5-S1 sensation intact bilaterally. Normal patellar reflexes bilaterally. No  foot drop bilaterally.      Assessment & Plan:  For recent back pain will give you medrol taper dose and have you continue flexeril.  Since pain despite prior treatment will refer to sport medicine.  Follow up date to be determined after review sports med note.  While on medrol advised strict low sugar diet.  20 minutes spent with pt.  Mackie Pai, PA-C

## 2019-12-03 NOTE — Patient Instructions (Signed)
For recent back pain will give you medrol taper dose and have you continue flexeril.  Since pain despite prior treatment will refer to sport medicine.  Follow up date to be determined after review sports med note.  While on medrol advised strict low sugar diet.

## 2019-12-06 ENCOUNTER — Ambulatory Visit: Payer: Self-pay | Admitting: Family Medicine

## 2019-12-06 NOTE — Progress Notes (Deleted)
  Kristine Bell - 49 y.o. female MRN [id number]  Date of birth: [birth year]  SUBJECTIVE:  Including CC & ROS.  No chief complaint on file.   Kristine Bell is a 49 y.o. female that is  ***.  ***   Review of Systems See HPI   HISTORY: Past Medical, Surgical, Social, and Family History Reviewed & Updated per EMR.   Pertinent Historical Findings include:  Past Medical History:  Diagnosis Date  . Diabetes mellitus without complication ([place])   . GERD (gastroesophageal reflux disease)   . Hyperlipidemia   . Hypertension   . Kidney stone   . Kidney stones   . Obesity     Past Surgical History:  Procedure Laterality Date  . ABLATION ON ENDOMETRIOSIS    . HERNIA REPAIR    . LITHOTRIPSY    . TUBAL LIGATION      Family History  Problem Relation Age of Onset  . Hyperlipidemia Father   . Hypertension Father   . Hypertension Brother   . Cervical cancer Maternal Grandmother   . Crohn's disease Brother     Social History   Socioeconomic History  . Marital status: Single    Spouse name: Not on file  . Number of children: Not on file  . Years of education: Not on file  . Highest education level: Not on file  Occupational History  . Not on file  Tobacco Use  . Smoking status: Current Every Day Smoker    Packs/day: 0.50    Types: Cigarettes  . Smokeless tobacco: Never Used  Substance and Sexual Activity  . Alcohol use: No  . Drug use: No  . Sexual activity: Not on file  Other Topics Concern  . Not on file  Social History Narrative  . Not on file   Social Determinants of Health   Financial Resource Strain:   . Difficulty of Paying Living Expenses:   Food Insecurity:   . Worried About Charity fundraiser in the Last Year:   . Arboriculturist in the Last Year:   Transportation Needs:   . Film/video editor (Medical):   Marland Kitchen Lack of Transportation (Non-Medical):   Physical Activity:   . Days of Exercise per Week:   . Minutes of Exercise per Session:   Stress:   .  Feeling of Stress :   Social Connections:   . Frequency of Communication with Friends and Family:   . Frequency of Social Gatherings with Friends and Family:   . Attends Religious Services:   . Active Member of Clubs or Organizations:   . Attends Archivist Meetings:   Marland Kitchen Marital Status:   Intimate Partner Violence:   . Fear of Current or Ex-Partner:   . Emotionally Abused:   Marland Kitchen Physically Abused:   . Sexually Abused:      PHYSICAL EXAM:  VS: There were no vitals taken for this visit. Physical Exam Gen: NAD, alert, cooperative with exam, well-appearing MSK:  ***      ASSESSMENT & PLAN:   No problem-specific Assessment & Plan notes found for this encounter.

## 2019-12-12 ENCOUNTER — Ambulatory Visit: Payer: Self-pay | Admitting: Family Medicine

## 2019-12-12 NOTE — Progress Notes (Deleted)
  Kristine Bell - 49 y.o. female MRN [id number]  Date of birth: 03/19/1971  SUBJECTIVE:  Including CC & ROS.  No chief complaint on file.   Kristine Bell is a 49 y.o. female that is  ***.  ***   Review of Systems See HPI   HISTORY: Past Medical, Surgical, Social, and Family History Reviewed & Updated per EMR.   Pertinent Historical Findings include:  Past Medical History:  Diagnosis Date  . Diabetes mellitus without complication ([place])   . GERD (gastroesophageal reflux disease)   . Hyperlipidemia   . Hypertension   . Kidney stone   . Kidney stones   . Obesity     Past Surgical History:  Procedure Laterality Date  . ABLATION ON ENDOMETRIOSIS    . HERNIA REPAIR    . LITHOTRIPSY    . TUBAL LIGATION      Family History  Problem Relation Age of Onset  . Hyperlipidemia Father   . Hypertension Father   . Hypertension Brother   . Cervical cancer Maternal Grandmother   . Crohn's disease Brother     Social History   Socioeconomic History  . Marital status: Single    Spouse name: Not on file  . Number of children: Not on file  . Years of education: Not on file  . Highest education level: Not on file  Occupational History  . Not on file  Tobacco Use  . Smoking status: Current Every Day Smoker    Packs/day: 0.50    Types: Cigarettes  . Smokeless tobacco: Never Used  Substance and Sexual Activity  . Alcohol use: No  . Drug use: No  . Sexual activity: Not on file  Other Topics Concern  . Not on file  Social History Narrative  . Not on file   Social Determinants of Health   Financial Resource Strain:   . Difficulty of Paying Living Expenses:   Food Insecurity:   . Worried About Charity fundraiser in the Last Year:   . Arboriculturist in the Last Year:   Transportation Needs:   . Film/video editor (Medical):   Marland Kitchen Lack of Transportation (Non-Medical):   Physical Activity:   . Days of Exercise per Week:   . Minutes of Exercise per Session:   Stress:   .  Feeling of Stress :   Social Connections:   . Frequency of Communication with Friends and Family:   . Frequency of Social Gatherings with Friends and Family:   . Attends Religious Services:   . Active Member of Clubs or Organizations:   . Attends Archivist Meetings:   Marland Kitchen Marital Status:   Intimate Partner Violence:   . Fear of Current or Ex-Partner:   . Emotionally Abused:   Marland Kitchen Physically Abused:   . Sexually Abused:      PHYSICAL EXAM:  VS: There were no vitals taken for this visit. Physical Exam Gen: NAD, alert, cooperative with exam, well-appearing MSK:  ***      ASSESSMENT & PLAN:   No problem-specific Assessment & Plan notes found for this encounter.

## 2019-12-18 ENCOUNTER — Encounter (HOSPITAL_BASED_OUTPATIENT_CLINIC_OR_DEPARTMENT_OTHER): Payer: Self-pay | Admitting: *Deleted

## 2019-12-18 ENCOUNTER — Emergency Department (HOSPITAL_BASED_OUTPATIENT_CLINIC_OR_DEPARTMENT_OTHER)
Admission: EM | Admit: 2019-12-18 | Discharge: 2019-12-18 | Disposition: A | Discharge status: Home / Self Care | Payer: HRSA Program | Attending: Emergency Medicine | Admitting: Emergency Medicine

## 2019-12-18 ENCOUNTER — Other Ambulatory Visit: Payer: Self-pay

## 2019-12-18 DIAGNOSIS — Z888 Allergy status to other drugs, medicaments and biological substances status: Secondary | ICD-10-CM | POA: Diagnosis not present

## 2019-12-18 DIAGNOSIS — R0602 Shortness of breath: Secondary | ICD-10-CM | POA: Insufficient documentation

## 2019-12-18 DIAGNOSIS — R197 Diarrhea, unspecified: Secondary | ICD-10-CM | POA: Diagnosis not present

## 2019-12-18 DIAGNOSIS — Z7982 Long term (current) use of aspirin: Secondary | ICD-10-CM | POA: Insufficient documentation

## 2019-12-18 DIAGNOSIS — E119 Type 2 diabetes mellitus without complications: Secondary | ICD-10-CM | POA: Diagnosis not present

## 2019-12-18 DIAGNOSIS — Z7984 Long term (current) use of oral hypoglycemic drugs: Secondary | ICD-10-CM | POA: Insufficient documentation

## 2019-12-18 DIAGNOSIS — Z79899 Other long term (current) drug therapy: Secondary | ICD-10-CM | POA: Diagnosis not present

## 2019-12-18 DIAGNOSIS — Z20822 Contact with and (suspected) exposure to covid-19: Secondary | ICD-10-CM | POA: Diagnosis not present

## 2019-12-18 DIAGNOSIS — R05 Cough: Secondary | ICD-10-CM | POA: Diagnosis not present

## 2019-12-18 DIAGNOSIS — I10 Essential (primary) hypertension: Secondary | ICD-10-CM | POA: Insufficient documentation

## 2019-12-18 DIAGNOSIS — F17210 Nicotine dependence, cigarettes, uncomplicated: Secondary | ICD-10-CM | POA: Insufficient documentation

## 2019-12-18 DIAGNOSIS — E785 Hyperlipidemia, unspecified: Secondary | ICD-10-CM | POA: Diagnosis not present

## 2019-12-18 DIAGNOSIS — Z886 Allergy status to analgesic agent status: Secondary | ICD-10-CM | POA: Insufficient documentation

## 2019-12-18 DIAGNOSIS — Z885 Allergy status to narcotic agent status: Secondary | ICD-10-CM | POA: Diagnosis not present

## 2019-12-18 LAB — SARS CORONAVIRUS 2 (TAT 6-24 HRS): SARS Coronavirus 2: NEGATIVE

## 2019-12-18 MED ORDER — BENZONATATE 100 MG PO CAPS: 100.0000 mg | ORAL_CAPSULE | Freq: Three times a day (TID) | ORAL | 0 refills | Status: DC

## 2019-12-18 NOTE — ED Provider Notes (Signed)
[facility] EMERGENCY DEPARTMENT Provider Note   CSN: [id number] Arrival date & time: 12/18/19  1554     History Chief Complaint  Patient presents with  . Shortness of Breath    Kristine Bell is a 49 y.o. female.  HPI  Patient is a 49 year old female with a history of DM, GERD, HLD, HTN, obesity  Patient presents today with 2 days of mild shortness of breath when she is exerting herself at work.  She states that it is associated with a cough.  She denies any fevers or chills, body aches, nausea, vomiting, headache, lightheadedness, dizziness, chest pain, weakness or fatigue.  She states that she has no known contact with Covid however she states that she does work in a close places with people however they all wear masks.  She also states that she has had some diarrhea that started yesterday however she has not had any today.  She states that she had a normal bowel movement prior to ED evaluation.  She denies any history of blood clots, is not a smoker, denies any lower leg swelling or bilateral leg swelling.  Patient is requesting a Covid test today so she is able to go to work.  She states that she also needs something for the cough.  Patient states she also endorses some congestion.  States that she feels that she has an allergies.     Past Medical History:  Diagnosis Date  . Diabetes mellitus without complication ([place])   . GERD (gastroesophageal reflux disease)   . Hyperlipidemia   . Hypertension   . Kidney stone   . Kidney stones   . Obesity     Patient Active Problem List   Diagnosis Date Noted  . 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 Hydronephrosis of left kidney 08/11/2015  . Ureteral calculus, left 08/11/2015  . Panic attack 03/09/2015  . Pelvic pain in female 03/09/2015  . Polyuria 03/09/2015  . 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 Closed fracture of lateral malleolus 01/20/2015  . Sprain of left ankle 01/20/2015  . Chronic pain syndrome  12/02/2014  . Atypical chest pain 11/11/2014  . Hypercalcemia 11/11/2014  . Bilateral carpal tunnel syndrome 10/23/2014  . Encounter for monitoring opioid maintenance therapy 10/02/2014  . Chronic constipation 07/15/2014  . Diabetes mellitus type 2, controlled, without complications ([place]) 99991111  . Hypokalemia 07/15/2014  . Polycystic ovarian syndrome 07/15/2014  . Morbid obesity ([place]) 05/15/2014  . Ovarian cyst 03/17/2014  . Allergic rhinitis 12/31/2013  . Hyposmolality and/or hyponatremia 12/31/2013  . Iron deficiency anemia 12/31/2013  . Leiomyoma of uterus 12/31/2013  . Tobacco use disorder 12/31/2013  . Urinary incontinence 12/31/2013  . Excessive and frequent menstruation 09/18/2013  . Malaise and fatigue 09/18/2013  . Metrorrhagia 09/18/2013  . Symptom associated with female genital organs 09/18/2013  . Exostosis 08/26/2013  . Essential hypertension 05/27/2013  . Pain in joint involving lower leg 05/27/2013  . Osteoarthritis of knee 05/27/2013  . Cholelithiasis 03/11/2013  . Edema 03/11/2013  . Esophageal reflux 03/11/2013  . Neck pain 03/11/2013  . Backache 03/11/2013  . Depressive disorder 02/16/2013  . Dizziness 02/16/2013  . Dysphagia 02/16/2013  . Hypercholesterolemia 02/16/2013  . Migraine 02/16/2013  . Pain in thoracic spine 02/16/2013  . Primary localized osteoarthrosis, lower leg 02/16/2013  . Spasm of muscle 02/16/2013  . Ventral hernia 02/16/2013    Past Surgical History:  Procedure Laterality Date  . ABLATION ON ENDOMETRIOSIS    . HERNIA REPAIR    .  LITHOTRIPSY    . TUBAL LIGATION       OB History   No obstetric history on file.     Family History  Problem Relation Age of Onset  . Hyperlipidemia Father   . Hypertension Father   . Hypertension Brother   . Cervical cancer Maternal Grandmother   . Crohn's disease Brother     Social History   Tobacco Use  . Smoking status: Current Every Day Smoker    Packs/day: 0.50    Types:  Cigarettes  . Smokeless tobacco: Never Used  Substance Use Topics  . Alcohol use: No  . Drug use: No    Home Medications Prior to Admission medications   Medication Sig Start Date End Date Taking? Authorizing Provider  aspirin 81 MG chewable tablet Chew by mouth.    [provider]  atorvastatin (LIPITOR) 10 MG tablet Take 1 tablet (10 mg total) by mouth daily. 09/25/19   Saguier, Percell Miller, PA-C  benzonatate (TESSALON) 100 MG capsule Take 1 capsule (100 mg total) by mouth every 8 (eight) hours. 12/18/19   Tedd Sias, PA  buPROPion (WELLBUTRIN XL) 150 MG 24 hr tablet 1 tab po q day 10/09/19   Saguier, Percell Miller, PA-C  chlorthalidone (HYGROTON) 25 MG tablet Take 1 tablet (25 mg total) by mouth daily. 09/25/19   Saguier, Percell Miller, PA-C  cyclobenzaprine (FLEXERIL) 10 MG tablet Take 1 tablet (10 mg total) by mouth at bedtime. 11/26/19   Saguier, Percell Miller, PA-C  diclofenac sodium (VOLTAREN) 1 % GEL Apply 2 g topically 4 (four) times daily as needed. 07/15/19   Long, Wonda Olds, MD  losartan (COZAAR) 50 MG tablet Take 1 tablet (50 mg total) by mouth daily. 09/25/19   Saguier, Percell Miller, PA-C  metFORMIN (GLUCOPHAGE) 500 MG tablet Take 1 tablet (500 mg total) by mouth 2 (two) times daily with a meal. 09/25/19   Saguier, Percell Miller, PA-C  methylPREDNISolone (MEDROL) 4 MG tablet 5 tab po am and pm 4 tab po am and pm 3 tab po am and pm 2 tab po am and pm 1 tab po am and pm 12/03/19   Saguier, Percell Miller, PA-C  SUMAtriptan (IMITREX) 50 MG tablet Take 1 tablet (50 mg total) by mouth every 2 (two) hours as needed for migraine. May repeat in 2 hours if headache persists or recurs. 11/27/19   Saguier, Percell Miller, PA-C  traZODone (DESYREL) 50 MG tablet Take 0.5-1 tablets (25-50 mg total) by mouth at bedtime as needed for sleep. 10/09/19   Saguier, Percell Miller, PA-C    Allergies    Naproxen sodium, Naproxen, Sertraline, and Tramadol  Review of Systems   Review of Systems  Constitutional: Negative for chills and fever.  HENT:  Positive for congestion.   Eyes: Negative for pain.  Respiratory: Positive for cough and shortness of breath.   Cardiovascular: Negative for chest pain and leg swelling.  Gastrointestinal: Negative for abdominal pain and vomiting.  Genitourinary: Negative for dysuria.  Musculoskeletal: Negative for myalgias.  Skin: Negative for rash.  Neurological: Negative for dizziness and headaches.    Physical Exam Updated Vital Signs BP 140/62   Pulse 72   Temp 98 F (36.7 C)   Resp 18   Ht 5\' 5"  (1.651 m)   Wt (!) 166 kg   SpO2 100%   BMI 60.91 kg/m   Physical Exam Vitals and nursing note reviewed.  Constitutional:      General: She is not in acute distress.    Comments: Is pleasant, obese, 49 year old  woman.  In no acute distress.  Able answer questions appropriately follow commands.  HENT:     Head: Normocephalic and atraumatic.     Nose: Nose normal.     Mouth/Throat:     Mouth: Mucous membranes are moist.  Eyes:     General: No scleral icterus. Cardiovascular:     Rate and Rhythm: Normal rate and regular rhythm.     Pulses: Normal pulses.     Heart sounds: Normal heart sounds.     Comments: Regular rate and rhythm.  No murmurs rubs or gallops. Pulmonary:     Effort: Pulmonary effort is normal. No respiratory distress.     Breath sounds: No wheezing.     Comments: No increased work of breathing, patient is speaking in full sentences without difficulty pulse oximeter is 100% on room air.  Lungs are clear to auscultation bilaterally in all lung fields. Abdominal:     Palpations: Abdomen is soft.     Tenderness: There is no abdominal tenderness. There is no guarding or rebound.     Comments: He is nontender abdomen.  No guarding rebound or tenderness.  No CVA tenderness.  Musculoskeletal:     Cervical back: Normal range of motion and neck supple. No rigidity.     Right lower leg: No edema.     Left lower leg: No edema.  Skin:    General: Skin is warm and dry.     Capillary  Refill: Capillary refill takes less than 2 seconds.  Neurological:     Mental Status: She is alert. Mental status is at baseline.  Psychiatric:        Mood and Affect: Mood normal.        Behavior: Behavior normal.     ED Results / Procedures / Treatments   Labs (all labs ordered are listed, but only abnormal results are displayed) Labs Reviewed  SARS CORONAVIRUS 2 (TAT 6-24 HRS)    EKG None  Radiology No results found.  Procedures Procedures (including critical care time)  Medications Ordered in ED Medications - No data to display  ED Course  I have reviewed the triage vital signs and the nursing notes.  Pertinent labs & imaging results that were available during my care of the patient were reviewed by me and considered in my medical decision making (see chart for details).    MDM Rules/Calculators/A&P                      Patient is 49 year old female with past medical history detailed above.  Presented today for some shortness of breath which appears to only occur with exertion and has no associated chest pain nausea weakness fatigue fevers, or diaphoresis.  She denies any cardiac issues.  States that she otherwise feels well apart from some diarrhea.   Physical exam is unremarkable.  No lower extremity swelling or edema.  No calf tenderness.  No abnormalities on lung or cardiac exam.  Patient is well-appearing.  The causes for shortness of breath include but are not limited to Cardiac (AHF, pericardial effusion and tamponade, arrhythmias, ischemia, etc) Respiratory (COPD, asthma, pneumonia, pneumothorax, primary pulmonary hypertension) Hematological (anemia) Neuromuscular (ALS, Guillain-Barr, etc)  Good patient is well-appearing disposition and mild symptoms with very normal vitals low suspicion for emergent etiology of symptoms today.  Shared decision-making conversation with patient she would prefer to have Covid test to be discharged today.  I believe this is  reasonable.  I will ambulate patient  with pulse oximeter and if it is normal I will discharge home with PCR 6-24-hour Covid test.  I recommend patient isolate and use noted hygiene precautions while awaiting for results.   Also sent patient with Ladona Ridgel prescription.  Patient ambulated without desaturation.  Stable for discharge at this time.  She is agreeable to plan.  Strict return cautions given.  Yentl Rudesill was evaluated in Emergency Department on 12/18/2019 for the symptoms described in the history of present illness. She was evaluated in the context of the global COVID-19 pandemic, which necessitated consideration that the patient might be at risk for infection with the SARS-CoV-2 virus that causes COVID-19. Institutional protocols and algorithms that pertain to the evaluation of patients at risk for COVID-19 are in a state of rapid change based on information released by regulatory bodies including the CDC and federal and state organizations. These policies and algorithms were followed during the patient's care in the ED.   Final Clinical Impression(s) / ED Diagnoses Final diagnoses:  Suspected COVID-19 virus infection    Rx / DC Orders ED Discharge Orders         Ordered    benzonatate (TESSALON) 100 MG capsule  Every 8 hours     12/18/19 1636           Tedd Sias, [place] 12/18/19 1636    Kristine Speak, MD 12/18/19 2318

## 2019-12-18 NOTE — ED Triage Notes (Signed)
Pt c/o SOB, cough x 2 day, states need covid test to return to work

## 2019-12-18 NOTE — Discharge Instructions (Addendum)
Your COVID test is pending; you should expect results in 2-3 days. You can access your results on your MyChart--if you test positive you should receive a phone call.  In the meantime follow CDC guidelines and quarantine, wear a mask, wash hands often.   Please take over the counter vitamin D 2000-4000 units per day. I also recommend zinc 50 mg per day for the next two weeks.   Please return to ED if you feel have difficulty breathing or have emergent, new or concerning symptoms.  Patients who have symptoms consistent with COVID-19 should self isolated for: At least 3 days (72 hours) have passed since recovery, defined as resolution of fever without the use of fever reducing medications and improvement in respiratory symptoms (e.g., cough, shortness of breath), and At least 7 days have passed since symptoms first appeared.       Person Under Monitoring Name: Kristine Bell  Location: [address] [place] [address]   Infection Prevention Recommendations for Individuals Confirmed to have, or Being Evaluated for, 2019 Novel Coronavirus (COVID-19) Infection Who Receive Care at Home  Individuals who are confirmed to have, or are being evaluated for, COVID-19 should follow the prevention steps below until a healthcare provider or local or state health department says they can return to normal activities.  Stay home except to get medical care You should restrict activities outside your home, except for getting medical care. Do not go to work, school, or public areas, and do not use public transportation or taxis.  Call ahead before visiting your doctor Before your medical appointment, call the healthcare provider and tell them that you have, or are being evaluated for, COVID-19 infection. This will help the healthcare provider's office take steps to keep other people from getting infected. Ask your healthcare provider to call the local or state health department.  Monitor your  symptoms Seek prompt medical attention if your illness is worsening (e.g., difficulty breathing). Before going to your medical appointment, call the healthcare provider and tell them that you have, or are being evaluated for, COVID-19 infection. Ask your healthcare provider to call the local or state health department.  Wear a facemask You should wear a facemask that covers your nose and mouth when you are in the same room with other people and when you visit a healthcare provider. People who live with or visit you should also wear a facemask while they are in the same room with you.  Separate yourself from other people in your home As much as possible, you should stay in a different room from other people in your home. Also, you should use a separate bathroom, if available.  Avoid sharing household items You should not share dishes, drinking glasses, cups, eating utensils, towels, bedding, or other items with other people in your home. After using these items, you should wash them thoroughly with soap and water.  Cover your coughs and sneezes Cover your mouth and nose with a tissue when you cough or sneeze, or you can cough or sneeze into your sleeve. Throw used tissues in a lined trash can, and immediately wash your hands with soap and water for at least 20 seconds or use an alcohol-based hand rub.  Wash your [facility] your hands often and thoroughly with soap and water for at least 20 seconds. You can use an alcohol-based hand sanitizer if soap and water are not available and if your hands are not visibly dirty. Avoid touching your eyes, nose, and  mouth with unwashed hands.   Prevention Steps for Caregivers and Household Members of Individuals Confirmed to have, or Being Evaluated for, COVID-19 Infection Being Cared for in the Home  If you live with, or provide care at home for, a person confirmed to have, or being evaluated for, COVID-19 infection please follow these guidelines  to prevent infection:  Follow healthcare provider's instructions Make sure that you understand and can help the patient follow any healthcare provider instructions for all care.  Provide for the patient's basic needs You should help the patient with basic needs in the home and provide support for getting groceries, prescriptions, and other personal needs.  Monitor the patient's symptoms If they are getting sicker, call his or her medical provider and tell them that the patient has, or is being evaluated for, COVID-19 infection. This will help the healthcare provider's office take steps to keep other people from getting infected. Ask the healthcare provider to call the local or state health department.  Limit the number of people who have contact with the patient If possible, have only one caregiver for the patient. Other household members should stay in another home or place of residence. If this is not possible, they should stay in another room, or be separated from the patient as much as possible. Use a separate bathroom, if available. Restrict visitors who do not have an essential need to be in the home.  Keep older adults, very young children, and other sick people away from the patient Keep older adults, very young children, and those who have compromised immune systems or chronic health conditions away from the patient. This includes people with chronic heart, lung, or kidney conditions, diabetes, and cancer.  Ensure good ventilation Make sure that shared spaces in the home have good air flow, such as from an air conditioner or an opened window, weather permitting.  Wash your hands often Wash your hands often and thoroughly with soap and water for at least 20 seconds. You can use an alcohol based hand sanitizer if soap and water are not available and if your hands are not visibly dirty. Avoid touching your eyes, nose, and mouth with unwashed hands. Use disposable paper towels to  dry your hands. If not available, use dedicated cloth towels and replace them when they become wet.  Wear a facemask and gloves Wear a disposable facemask at all times in the room and gloves when you touch or have contact with the patient's blood, body fluids, and/or secretions or excretions, such as sweat, saliva, sputum, nasal mucus, vomit, urine, or feces.  Ensure the mask fits over your nose and mouth tightly, and do not touch it during use. Throw out disposable facemasks and gloves after using them. Do not reuse. Wash your hands immediately after removing your facemask and gloves. If your personal clothing becomes contaminated, carefully remove clothing and launder. Wash your hands after handling contaminated clothing. Place all used disposable facemasks, gloves, and other waste in a lined container before disposing them with other household waste. Remove gloves and wash your hands immediately after handling these items.  Do not share dishes, glasses, or other household items with the patient Avoid sharing household items. You should not share dishes, drinking glasses, cups, eating utensils, towels, bedding, or other items with a patient who is confirmed to have, or being evaluated for, COVID-19 infection. After the person uses these items, you should wash them thoroughly with soap and water.  Wash laundry thoroughly Immediately remove and wash  clothes or bedding that have blood, body fluids, and/or secretions or excretions, such as sweat, saliva, sputum, nasal mucus, vomit, urine, or feces, on them. Wear gloves when handling laundry from the patient. Read and follow directions on labels of laundry or clothing items and detergent. In general, wash and dry with the warmest temperatures recommended on the label.  Clean all areas the individual has used often Clean all touchable surfaces, such as counters, tabletops, doorknobs, bathroom fixtures, toilets, phones, keyboards, tablets, and bedside  tables, every day. Also, clean any surfaces that may have blood, body fluids, and/or secretions or excretions on them. Wear gloves when cleaning surfaces the patient has come in contact with. Use a diluted bleach solution (e.g., dilute bleach with 1 part bleach and 10 parts water) or a household disinfectant with a label that says EPA-registered for coronaviruses. To make a bleach solution at home, add 1 tablespoon of bleach to 1 quart (4 cups) of water. For a larger supply, add  cup of bleach to 1 gallon (16 cups) of water. Read labels of cleaning products and follow recommendations provided on product labels. Labels contain instructions for safe and effective use of the cleaning product including precautions you should take when applying the product, such as wearing gloves or eye protection and making sure you have good ventilation during use of the product. Remove gloves and wash hands immediately after cleaning.  Monitor yourself for signs and symptoms of illness Caregivers and household members are considered close contacts, should monitor their health, and will be asked to limit movement outside of the home to the extent possible. Follow the monitoring steps for close contacts listed on the symptom monitoring form.   ? If you have additional questions, contact your local health department or call the epidemiologist on call at [phone] (available 24/7). ? This guidance is subject to change. For the most up-to-date guidance from [facility], please refer to their website: [email or web address]

## 2019-12-19 ENCOUNTER — Ambulatory Visit: Payer: Self-pay | Admitting: Family Medicine

## 2019-12-19 NOTE — Progress Notes (Deleted)
  Kristine Bell - 49 y.o. female MRN [id number]  Date of birth: [birth year]  SUBJECTIVE:  Including CC & ROS.  No chief complaint on file.   Kristine Bell is a 49 y.o. female that is  ***.  ***   Review of Systems See HPI   HISTORY: Past Medical, Surgical, Social, and Family History Reviewed & Updated per EMR.   Pertinent Historical Findings include:  Past Medical History:  Diagnosis Date  . Diabetes mellitus without complication ([place])   . GERD (gastroesophageal reflux disease)   . Hyperlipidemia   . Hypertension   . Kidney stone   . Kidney stones   . Obesity     Past Surgical History:  Procedure Laterality Date  . ABLATION ON ENDOMETRIOSIS    . HERNIA REPAIR    . LITHOTRIPSY    . TUBAL LIGATION      Family History  Problem Relation Age of Onset  . Hyperlipidemia Father   . Hypertension Father   . Hypertension Brother   . Cervical cancer Maternal Grandmother   . Crohn's disease Brother     Social History   Socioeconomic History  . Marital status: Single    Spouse name: Not on file  . Number of children: Not on file  . Years of education: Not on file  . Highest education level: Not on file  Occupational History  . Not on file  Tobacco Use  . Smoking status: Current Every Day Smoker    Packs/day: 0.50    Types: Cigarettes  . Smokeless tobacco: Never Used  Substance and Sexual Activity  . Alcohol use: No  . Drug use: No  . Sexual activity: Not on file  Other Topics Concern  . Not on file  Social History Narrative  . Not on file   Social Determinants of Health   Financial Resource Strain:   . Difficulty of Paying Living Expenses:   Food Insecurity:   . Worried About Charity fundraiser in the Last Year:   . Arboriculturist in the Last Year:   Transportation Needs:   . Film/video editor (Medical):   Marland Kitchen Lack of Transportation (Non-Medical):   Physical Activity:   . Days of Exercise per Week:   . Minutes of Exercise per Session:   Stress:   .  Feeling of Stress :   Social Connections:   . Frequency of Communication with Friends and Family:   . Frequency of Social Gatherings with Friends and Family:   . Attends Religious Services:   . Active Member of Clubs or Organizations:   . Attends Archivist Meetings:   Marland Kitchen Marital Status:   Intimate Partner Violence:   . Fear of Current or Ex-Partner:   . Emotionally Abused:   Marland Kitchen Physically Abused:   . Sexually Abused:      PHYSICAL EXAM:  VS: There were no vitals taken for this visit. Physical Exam Gen: NAD, alert, cooperative with exam, well-appearing MSK:  ***      ASSESSMENT & PLAN:   No problem-specific Assessment & Plan notes found for this encounter.

## 2020-01-09 ENCOUNTER — Ambulatory Visit: Payer: Medicaid Other | Admitting: Medical

## 2020-01-24 ENCOUNTER — Ambulatory Visit: Payer: Medicaid Other | Admitting: Medical

## 2020-01-31 ENCOUNTER — Ambulatory Visit: Payer: Medicaid Other | Admitting: Medical

## 2020-02-06 ENCOUNTER — Ambulatory Visit: Payer: Medicaid Other | Admitting: Medical

## 2020-03-01 ENCOUNTER — Other Ambulatory Visit: Payer: Self-pay | Admitting: Medical

## 2020-06-01 ENCOUNTER — Encounter: Payer: Self-pay | Admitting: Family Medicine

## 2020-06-01 ENCOUNTER — Other Ambulatory Visit: Payer: Self-pay

## 2020-06-01 ENCOUNTER — Ambulatory Visit (INDEPENDENT_AMBULATORY_CARE_PROVIDER_SITE_OTHER): Payer: Self-pay | Admitting: Family Medicine

## 2020-06-01 VITALS — BP 167/91 | HR 91 | Ht 65.0 in

## 2020-06-01 DIAGNOSIS — M25511 Pain in right shoulder: Secondary | ICD-10-CM

## 2020-06-01 DIAGNOSIS — M899 Disorder of bone, unspecified: Secondary | ICD-10-CM | POA: Insufficient documentation

## 2020-06-01 DIAGNOSIS — M25512 Pain in left shoulder: Secondary | ICD-10-CM

## 2020-06-01 DIAGNOSIS — M778 Other enthesopathies, not elsewhere classified: Secondary | ICD-10-CM | POA: Insufficient documentation

## 2020-06-01 DIAGNOSIS — M7581 Other shoulder lesions, right shoulder: Secondary | ICD-10-CM | POA: Insufficient documentation

## 2020-06-01 MED ORDER — KETOROLAC TROMETHAMINE 30 MG/ML IJ SOLN
30.0000 mg | Freq: Once | INTRAMUSCULAR | Status: AC
  Administered 2020-06-01: 30 mg via INTRAMUSCULAR

## 2020-06-01 NOTE — Assessment & Plan Note (Signed)
Right side seems more capsular in nature and has changes on xray. She is having periscapular pain as well. Likely the results of scapular dysfunction that is contributing to her pain.  -Counseled on home exercise therapy and supportive care. -IM Toradol. -Provided samples of Duexis. -If no improvement may need to consider glenohumeral injections.

## 2020-06-01 NOTE — Addendum Note (Signed)
Addended by: Sherrie George F on: 06/01/2020 03:20 PM   Modules accepted: Orders

## 2020-06-01 NOTE — Progress Notes (Signed)
Kristine Bell - 49 y.o. female MRN [id number]  Date of birth: Jan 21, 1971  SUBJECTIVE:  Including CC & ROS.  Chief Complaint  Patient presents with  . Shoulder Pain    bilateral    Kristine Bell is a 49 y.o. female that is presenting with bilateral shoulder pain as well as periscapular pain.  Symptoms ongoing for the past month.  She di improvement with a glenohumeral injection of the right shoulder back in January.  No injury or inciting event.  Symptoms are worse the more she uses her hands..   Review of Systems See HPI   HISTORY: Past Medical, Surgical, Social, and Family History Reviewed & Updated per EMR.   Pertinent Historical Findings include:  Past Medical History:  Diagnosis Date  . Diabetes mellitus without complication ([place])   . GERD (gastroesophageal reflux disease)   . Hyperlipidemia   . Hypertension   . Kidney stone   . Kidney stones   . Obesity     Past Surgical History:  Procedure Laterality Date  . ABLATION ON ENDOMETRIOSIS    . HERNIA REPAIR    . LITHOTRIPSY    . TUBAL LIGATION      Family History  Problem Relation Age of Onset  . Hyperlipidemia Father   . Hypertension Father   . Hypertension Brother   . Cervical cancer Maternal Grandmother   . Crohn's disease Brother     Social History   Socioeconomic History  . Marital status: Single    Spouse name: Not on file  . Number of children: Not on file  . Years of education: Not on file  . Highest education level: Not on file  Occupational History  . Not on file  Tobacco Use  . Smoking status: Current Every Day Smoker    Packs/day: 0.50    Types: Cigarettes  . Smokeless tobacco: Never Used  Substance and Sexual Activity  . Alcohol use: No  . Drug use: No  . Sexual activity: Not on file  Other Topics Concern  . Not on file  Social History Narrative  . Not on file   Social Determinants of Health   Financial Resource Strain:   . Difficulty of Paying Living Expenses: Not on file  Food  Insecurity:   . Worried About Charity fundraiser in the Last Year: Not on file  . Ran Out of Food in the Last Year: Not on file  Transportation Needs:   . Lack of Transportation (Medical): Not on file  . Lack of Transportation (Non-Medical): Not on file  Physical Activity:   . Days of Exercise per Week: Not on file  . Minutes of Exercise per Session: Not on file  Stress:   . Feeling of Stress : Not on file  Social Connections:   . Frequency of Communication with Friends and Family: Not on file  . Frequency of Social Gatherings with Friends and Family: Not on file  . Attends Religious Services: Not on file  . Active Member of Clubs or Organizations: Not on file  . Attends Archivist Meetings: Not on file  . Marital Status: Not on file  Intimate Partner Violence:   . Fear of Current or Ex-Partner: Not on file  . Emotionally Abused: Not on file  . Physically Abused: Not on file  . Sexually Abused: Not on file     PHYSICAL EXAM:  VS: BP (!) 167/91   Pulse 91   Ht 5\' 5"  (1.651 m)   BMI  60.91 kg/m  Physical Exam Gen: NAD, alert, cooperative with exam, well-appearing MSK:  Right and left shoulder. Normal internal and external rotation. Limited flexion on the right compared to left. Mild pain with active can testing. Pain over the medial border of the scapula. No winging of the scapula. Neurovascularly intact     ASSESSMENT & PLAN:   Scapular dysfunction Has some pain over the periscapular region that is likely related to some of the scapular dysfunction. -Counseled on home exercise therapy and supportive care. -Could consider physical therapy  Acute pain of both shoulders Right side seems more capsular in nature and has changes on xray. She is having periscapular pain as well. Likely the results of scapular dysfunction that is contributing to her pain.  -Counseled on home exercise therapy and supportive care. -IM Toradol. -Provided samples of Duexis. -If  no improvement may need to consider glenohumeral injections.

## 2020-06-01 NOTE — Progress Notes (Signed)
Medication Samples have been provided to the patient.  Drug name: Duexis       Strength: 800mg /26.6mg         Qty: 2 boxes  LOT: [id number]  Exp.Date: 12/2020  Dosing instructions: take 1 tablet by mouth three (3) times a day.  The patient has been instructed regarding the correct time, dose, and frequency of taking this medication, including desired effects and most common side effects.   Sherrie George, MA 3:19 PM 06/01/2020

## 2020-06-01 NOTE — Assessment & Plan Note (Signed)
Has some pain over the periscapular region that is likely related to some of the scapular dysfunction. -Counseled on home exercise therapy and supportive care. -Could consider physical therapy

## 2020-06-01 NOTE — Patient Instructions (Signed)
Good to see you Please try heat  Please try the exercises  Please take the duexis for three days straight and then as needed   Please send me a message in MyChart with any questions or updates.  Please see me back in 4 weeks.   --Dr. Raeford Razor

## 2020-07-01 ENCOUNTER — Ambulatory Visit (INDEPENDENT_AMBULATORY_CARE_PROVIDER_SITE_OTHER): Payer: Self-pay | Admitting: Family Medicine

## 2020-07-01 ENCOUNTER — Encounter: Payer: Self-pay | Admitting: Family Medicine

## 2020-07-01 ENCOUNTER — Other Ambulatory Visit: Payer: Self-pay

## 2020-07-01 VITALS — BP 196/110 | HR 69 | Ht 65.0 in | Wt 320.0 lb

## 2020-07-01 DIAGNOSIS — M25511 Pain in right shoulder: Secondary | ICD-10-CM

## 2020-07-01 DIAGNOSIS — M25512 Pain in left shoulder: Secondary | ICD-10-CM

## 2020-07-01 NOTE — Progress Notes (Addendum)
Kristine Bell - 49 y.o. female MRN [id number]  Date of birth: 07-26-1971  SUBJECTIVE:  Including CC & ROS.  Chief Complaint  Patient presents with  . Follow-up    bilateral shoulder    Kristine Bell is a 49 y.o. female that is presenting with bilateral shoulder pain.  Her symptoms are ongoing.  She did get improvement with the glenohumeral injection in January.  She reports a history of rheumatoid arthritis.  Her symptoms are exacerbated with any overhead motions and reaching out.  She has recently quit her job due to an exacerbation of her pain.   Review of Systems See HPI   HISTORY: Past Medical, Surgical, Social, and Family History Reviewed & Updated per EMR.   Pertinent Historical Findings include:  Past Medical History:  Diagnosis Date  . Diabetes mellitus without complication ([place])   . GERD (gastroesophageal reflux disease)   . Hyperlipidemia   . Hypertension   . Kidney stone   . Kidney stones   . Obesity     Past Surgical History:  Procedure Laterality Date  . ABLATION ON ENDOMETRIOSIS    . HERNIA REPAIR    . LITHOTRIPSY    . TUBAL LIGATION      Family History  Problem Relation Age of Onset  . Hyperlipidemia Father   . Hypertension Father   . Hypertension Brother   . Cervical cancer Maternal Grandmother   . Crohn's disease Brother     Social History   Socioeconomic History  . Marital status: Single    Spouse name: Not on file  . Number of children: Not on file  . Years of education: Not on file  . Highest education level: Not on file  Occupational History  . Not on file  Tobacco Use  . Smoking status: Current Every Day Smoker    Packs/day: 0.50    Types: Cigarettes  . Smokeless tobacco: Never Used  Substance and Sexual Activity  . Alcohol use: No  . Drug use: No  . Sexual activity: Not on file  Other Topics Concern  . Not on file  Social History Narrative  . Not on file   Social Determinants of Health   Financial Resource Strain:   .  Difficulty of Paying Living Expenses: Not on file  Food Insecurity:   . Worried About Charity fundraiser in the Last Year: Not on file  . Ran Out of Food in the Last Year: Not on file  Transportation Needs:   . Lack of Transportation (Medical): Not on file  . Lack of Transportation (Non-Medical): Not on file  Physical Activity:   . Days of Exercise per Week: Not on file  . Minutes of Exercise per Session: Not on file  Stress:   . Feeling of Stress : Not on file  Social Connections:   . Frequency of Communication with Friends and Family: Not on file  . Frequency of Social Gatherings with Friends and Family: Not on file  . Attends Religious Services: Not on file  . Active Member of Clubs or Organizations: Not on file  . Attends Archivist Meetings: Not on file  . Marital Status: Not on file  Intimate Partner Violence:   . Fear of Current or Ex-Partner: Not on file  . Emotionally Abused: Not on file  . Physically Abused: Not on file  . Sexually Abused: Not on file     PHYSICAL EXAM:  VS: BP (!) 196/110   Pulse 69   Ht  5\' 5"  (1.651 m)   Wt (!) 320 lb (145.2 kg)   BMI 53.25 kg/m  Physical Exam Gen: NAD, alert, cooperative with exam, well-appearing MSK:  Right shoulder: Limited external rotation and pain with external rotation abduction. Normal internal rotation. Normal grip strength. Minor pain with empty can testing. Left shoulder: Normal external rotation and internal rotation. Normal grip strength. Normal empty can testing. Neurovascular intact     ASSESSMENT & PLAN:   Acute pain of both shoulders Has more of a capsular issue on the right.  Does report a history of rheumatoid arthritis which could be the reason for her multiple joint pains.  Also has some hypercalcemia with her last metabolic panel.  May need to repeat this as well. -Counseled on home exercise therapy and supportive care. -CRP, sed rate, ANA, uric acid. - provided rayos samples  -May  need to consider physical therapy, imaging or repeat injection.

## 2020-07-01 NOTE — Assessment & Plan Note (Addendum)
Has more of a capsular issue on the right.  Does report a history of rheumatoid arthritis which could be the reason for her multiple joint pains.  Also has some hypercalcemia with her last metabolic panel.  May need to repeat this as well. -Counseled on home exercise therapy and supportive care. -CRP, sed rate, ANA, uric acid. - provided rayos samples  -May need to consider physical therapy, imaging or repeat injection.

## 2020-07-01 NOTE — Patient Instructions (Signed)
Good to see you Please take 2 pills of rayos as close to 10 pm as you can  I will call with the results from today   Please send me a message in MyChart with any questions or updates.  We will discuss follow up after the results.   --Dr. Raeford Razor

## 2020-07-02 ENCOUNTER — Telehealth: Payer: Self-pay | Admitting: Family Medicine

## 2020-07-02 NOTE — Telephone Encounter (Signed)
Patient called states sees lab results on mychart but doesn't know what they mean, advised Dr. Raeford Razor to contact her for review once all results present.  --forwarding message .  -glh

## 2020-07-03 LAB — ANA,IFA RA DIAG PNL W/RFLX TIT/PATN
ANA Titer 1: NEGATIVE
Cyclic Citrullin Peptide Ab: 6 units (ref 0–19)
Rheumatoid fact SerPl-aCnc: <10 IU/mL (ref 0.0–13.9)

## 2020-07-03 LAB — URIC ACID: Uric Acid: 5.7 mg/dL (ref 2.6–6.2)

## 2020-07-03 LAB — C-REACTIVE PROTEIN: CRP: 10 mg/L (ref 0–10)

## 2020-07-03 LAB — SEDIMENTATION RATE: Sed Rate: 20 mm/hr (ref 0–32)

## 2020-07-03 NOTE — Telephone Encounter (Signed)
Informed of results.   Rosemarie Ax, MD [facility] 07/03/2020, 8:39 AM

## 2020-07-13 ENCOUNTER — Telehealth: Payer: Self-pay | Admitting: Family Medicine

## 2020-07-13 NOTE — Telephone Encounter (Signed)
Called pt to schedule appt for Shoulder Injections, unable to pt has new job scheduled doesn't know what days off will be until Thurs 11/4( she will call us to schedule  --glh

## 2020-07-21 ENCOUNTER — Other Ambulatory Visit: Payer: Self-pay

## 2020-07-21 ENCOUNTER — Ambulatory Visit: Payer: Self-pay

## 2020-07-21 ENCOUNTER — Ambulatory Visit (INDEPENDENT_AMBULATORY_CARE_PROVIDER_SITE_OTHER): Payer: Medicaid Other | Admitting: Family Medicine

## 2020-07-21 ENCOUNTER — Encounter: Payer: Self-pay | Admitting: Family Medicine

## 2020-07-21 DIAGNOSIS — M25511 Pain in right shoulder: Secondary | ICD-10-CM

## 2020-07-21 DIAGNOSIS — M25512 Pain in left shoulder: Secondary | ICD-10-CM

## 2020-07-21 MED ORDER — TRIAMCINOLONE ACETONIDE 40 MG/ML IJ SUSP
40.0000 mg | Freq: Once | INTRAMUSCULAR | Status: AC
  Administered 2020-07-21: 40 mg via INTRA_ARTICULAR

## 2020-07-21 NOTE — Patient Instructions (Signed)
Good to see you Please try heat heat and ice  Physical therapy will call you   Please send me a message in MyChart with any questions or updates.  Please see me back in 4 weeks.   --Dr. Raeford Razor

## 2020-07-21 NOTE — Addendum Note (Signed)
Addended by: Sherrie George F on: 07/21/2020 12:13 PM   Modules accepted: Orders

## 2020-07-21 NOTE — Progress Notes (Signed)
Kristine Bell - 49 y.o. female MRN [id number]  Date of birth: April 05, 1971  SUBJECTIVE:  Including CC & ROS.  No chief complaint on file.   Kristine Bell is a 49 y.o. female that is presenting with acute worsening of her bilateral shoulder pain.  She did get initial improvement with the glenohumeral injection in January.  Lab work was unrevealing for any specific inflammatory change.  It seems to be worse the more active she is.   Review of Systems See HPI   HISTORY: Past Medical, Surgical, Social, and Family History Reviewed & Updated per EMR.   Pertinent Historical Findings include:  Past Medical History:  Diagnosis Date  . Diabetes mellitus without complication ([place])   . GERD (gastroesophageal reflux disease)   . Hyperlipidemia   . Hypertension   . Kidney stone   . Kidney stones   . Obesity     Past Surgical History:  Procedure Laterality Date  . ABLATION ON ENDOMETRIOSIS    . HERNIA REPAIR    . LITHOTRIPSY    . TUBAL LIGATION      Family History  Problem Relation Age of Onset  . Hyperlipidemia Father   . Hypertension Father   . Hypertension Brother   . Cervical cancer Maternal Grandmother   . Crohn's disease Brother     Social History   Socioeconomic History  . Marital status: Single    Spouse name: Not on file  . Number of children: Not on file  . Years of education: Not on file  . Highest education level: Not on file  Occupational History  . Not on file  Tobacco Use  . Smoking status: Current Every Day Smoker    Packs/day: 0.50    Types: Cigarettes  . Smokeless tobacco: Never Used  Substance and Sexual Activity  . Alcohol use: No  . Drug use: No  . Sexual activity: Not on file  Other Topics Concern  . Not on file  Social History Narrative  . Not on file   Social Determinants of Health   Financial Resource Strain:   . Difficulty of Paying Living Expenses: Not on file  Food Insecurity:   . Worried About Charity fundraiser in the Last Year: Not on  file  . Ran Out of Food in the Last Year: Not on file  Transportation Needs:   . Lack of Transportation (Medical): Not on file  . Lack of Transportation (Non-Medical): Not on file  Physical Activity:   . Days of Exercise per Week: Not on file  . Minutes of Exercise per Session: Not on file  Stress:   . Feeling of Stress : Not on file  Social Connections:   . Frequency of Communication with Friends and Family: Not on file  . Frequency of Social Gatherings with Friends and Family: Not on file  . Attends Religious Services: Not on file  . Active Member of Clubs or Organizations: Not on file  . Attends Archivist Meetings: Not on file  . Marital Status: Not on file  Intimate Partner Violence:   . Fear of Current or Ex-Partner: Not on file  . Emotionally Abused: Not on file  . Physically Abused: Not on file  . Sexually Abused: Not on file     PHYSICAL EXAM:  VS: There were no vitals taken for this visit. Physical Exam Gen: NAD, alert, cooperative with exam, well-appearing MSK:  Right and left shoulder: Normal range of motion. Normal strength resistance. Neurovascular intact  Aspiration/Injection Procedure Note Manvir Thorson [birth year]  Procedure: Injection Indications: Left shoulder pain  Procedure Details Consent: Risks of procedure as well as the alternatives and risks of each were explained to the (patient/caregiver).  Consent for procedure obtained. Time Out: Verified patient identification, verified procedure, site/side was marked, verified correct patient position, special equipment/implants available, medications/allergies/relevent history reviewed, required imaging and test results available.  Performed.  The area was cleaned with iodine and alcohol swabs.    The Left glenohumeral joint was injected using 5 cc of 1% lidocaine on a 22-gauge 3-1/2 inch needle.  The syringe was switched and a mixture containing 1 cc's of 40 mg Kenalog and 4 cc's of 0.25%  bupivacaine was injected.  Ultrasound was used. Images were obtained in short views showing the injection.     A sterile dressing was applied.  Patient did tolerate procedure well.  Aspiration/Injection Procedure Note Haniah Penny April 21, 1971  Procedure: Injection Indications: Right shoulder pain  Procedure Details Consent: Risks of procedure as well as the alternatives and risks of each were explained to the (patient/caregiver).  Consent for procedure obtained. Time Out: Verified patient identification, verified procedure, site/side was marked, verified correct patient position, special equipment/implants available, medications/allergies/relevent history reviewed, required imaging and test results available.  Performed.  The area was cleaned with iodine and alcohol swabs.    The right glenohumeral joint was injected using 5 cc of 1% lidocaine on a 22-gauge 3-1/2 inch needle.  The syringe was switched and a mixture containing 1 cc's of 40 mg Kenalog and 4 cc's of 0.25% bupivacaine was injected.  Ultrasound was used. Images were obtained in short views showing the injection.     A sterile dressing was applied.  Patient did tolerate procedure well.  ASSESSMENT & PLAN:   Acute pain of both shoulders Acute onchronic in nature. Has gotten improvement with previous injection. It seems more joint related.   -Counseled on home exercise therapy and supportive care. -Bilateral injection. -Referral to physical therapy. -Provided work note. -Could consider further imaging if ongoing.

## 2020-07-21 NOTE — Assessment & Plan Note (Addendum)
Acute onchronic in nature. Has gotten improvement with previous injection. It seems more joint related.   -Counseled on home exercise therapy and supportive care. -Bilateral injection. -Referral to physical therapy. -Provided work note. -Could consider further imaging if ongoing.

## 2020-08-04 ENCOUNTER — Other Ambulatory Visit: Payer: Self-pay

## 2020-08-04 ENCOUNTER — Ambulatory Visit: Payer: Self-pay | Attending: Family Medicine | Admitting: Physical Therapy

## 2020-08-04 ENCOUNTER — Encounter: Payer: Self-pay | Admitting: Physical Therapy

## 2020-08-04 VITALS — BP 140/80 | HR 69

## 2020-08-04 DIAGNOSIS — M6281 Muscle weakness (generalized): Secondary | ICD-10-CM

## 2020-08-04 DIAGNOSIS — M25512 Pain in left shoulder: Secondary | ICD-10-CM | POA: Insufficient documentation

## 2020-08-04 DIAGNOSIS — R293 Abnormal posture: Secondary | ICD-10-CM

## 2020-08-04 DIAGNOSIS — M25511 Pain in right shoulder: Secondary | ICD-10-CM | POA: Insufficient documentation

## 2020-08-04 DIAGNOSIS — M25612 Stiffness of left shoulder, not elsewhere classified: Secondary | ICD-10-CM

## 2020-08-04 DIAGNOSIS — G8929 Other chronic pain: Secondary | ICD-10-CM

## 2020-08-04 DIAGNOSIS — M25611 Stiffness of right shoulder, not elsewhere classified: Secondary | ICD-10-CM

## 2020-08-04 NOTE — Therapy (Signed)
[facility] [address]  [place], [place], [address] Phone: [phone]   Fax:  [phone]  Physical Therapy Evaluation  Patient Details  Name: Kristine Bell MRN: [id number] Date of Birth: 1970/10/10 Referring Provider (PT): Clearance Coots, MD   Encounter Date: 08/04/2020   PT End of Session - 08/04/20 1157    Visit Number 1    Number of Visits 7    Date for PT Re-Evaluation 09/15/20    Authorization Type Applied for [facility]    PT Start Time 0802    PT Stop Time 0841    PT Time Calculation (min) 39 min    Activity Tolerance Patient tolerated treatment well;Patient limited by pain    Behavior During Therapy [facility] for tasks assessed/performed           Past Medical History:  Diagnosis Date   Diabetes mellitus without complication ([place])    GERD (gastroesophageal reflux disease)    Hyperlipidemia    Hypertension    Kidney stone    Kidney stones    Obesity     Past Surgical History:  Procedure Laterality Date   ABLATION ON ENDOMETRIOSIS     HERNIA REPAIR     LITHOTRIPSY     TUBAL LIGATION      Vitals:   08/04/20 0809  BP: 140/80  Pulse: 69  SpO2: 98%      Subjective Assessment - 08/04/20 0803    Subjective Patient reports B shoulder pain for the past year, R>L. Denies specific precipitating trauma. 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 Pain occurs over the anterior shoulder with radiation up to the neck. Reports N/T in this same distribution, however also endorses a hx of carpal tunnel syndrome. Pain worse with lifting, laying on either side, reaching behind her back. Notes benefit from recent injection. Notes that her BP has been elevated d/t being in pain but unable to be seen by her MD d/t financial issues, thus has applied for [facility] Assistance.    Pertinent History kidney stones, HTN, HLD, GERD, DM    Limitations Lifting;House  hold activities    Diagnostic tests 07/15/19 R shoulder xray: No acute osseous findings. Inferior subluxation of the humeral head relative to the glenoid. This finding can be seen with joint effusion or ligamentous laxity    Patient Stated Goals decrease pain    Currently in Pain? Yes    Pain Score 5     Pain Location Shoulder    Pain Orientation Right;Left;Anterior    Pain Descriptors / Indicators Aching    Pain Type Chronic pain    Pain Radiating Towards up to B posterior neck              [facility] PT Assessment - 08/04/20 0813      Assessment   Medical Diagnosis Acute pain of B shoulders    Referring Provider (PT) Clearance Coots, MD    Onset Date/Surgical Date 08/05/19    Hand Dominance Right    Next MD Visit not scheduled    Prior Therapy yes      Precautions   Precaution Comments pt reporting uncontrolled BP       Balance Screen   Has the patient fallen in the past 6 months No    Has the patient had a decrease in activity level because of a fear of falling?  No    Is the  patient reluctant to leave their home because of a fear of falling?  No      Home Ec[place] residence    Living Arrangements Other relatives   granddaughter   Available Help at Discharge Family    Type of [place]      Prior Function   Level of Independence Independent    Vocation Part time employment    Cytogeneticist- standing, lifting, bending    Leisure none      Cognition   Overall Cognitive Status Within Functional Limits for tasks assessed      Sensation   Light Touch Appears Intact      Coordination   Gross Motor Movements are Fluid and Coordinated Yes      Posture/Postural Control   Posture/Postural Control Postural limitations    Postural Limitations Rounded Shoulders      ROM / Strength   AROM / PROM / Strength AROM;Strength      AROM   AROM Assessment Site Shoulder    Right/Left Shoulder Right;Left    Right Shoulder Flexion  113 Degrees   pain   Right Shoulder ABduction 85 Degrees   pain   Right Shoulder Internal Rotation --   FIR L5   Right Shoulder External Rotation --   FER T1   Left Shoulder Flexion 147 Degrees   pain   Left Shoulder ABduction 117 Degrees   pain   Left Shoulder Internal Rotation --   FIR T12   Left Shoulder External Rotation --   FER T1     Strength   Strength Assessment Site Shoulder    Right/Left Shoulder Right;Left    Right Shoulder Flexion 4-/5    Right Shoulder ABduction 4-/5    Right Shoulder Internal Rotation 3+/5    Right Shoulder External Rotation 3+/5    Left Shoulder Flexion 4-/5    Left Shoulder ABduction 4-/5    Left Shoulder Internal Rotation 4/5    Left Shoulder External Rotation 3+/5      Palpation   Palpation comment very diffusely TTP over B shoulders and cervical musculature, R>L; palpable restriction in R proximal biceps tendon, deltoid, and biceps muscle belly as well as B UT                      Objective measurements completed on examination: See above findings.               PT Education - 08/04/20 1157    Education Details prognosis, POC, HEP    Person(s) Educated Patient    Methods Explanation;Demonstration;Tactile cues;Verbal cues;Handout    Comprehension Verbalized understanding;Returned demonstration            PT Short Term Goals - 08/04/20 1205      PT SHORT TERM GOAL #1   Title Patient to be independent with initial HEP.    Time 2    Period Weeks    Status New    Target Date 08/18/20             PT Long Term Goals - 08/04/20 1205      PT LONG TERM GOAL #1   Title Patient to be independent with advanced HEP.    Time 6    Period Weeks    Status New    Target Date 09/15/20      PT LONG TERM GOAL #2   Title Patient to demonstrate B shoulder strength >/=4+/5.  Time 6    Period Weeks    Status New    Target Date 09/15/20      PT LONG TERM GOAL #3   Title Patient to demonstrate B shoulder AROM WFL  and without pain limiting.    Time 6    Period Weeks    Status New    Target Date 09/15/20      PT LONG TERM GOAL #4   Title Patient to report 70% improvement in sleeping tolerance d/t improvement in shoulder pain.    Time 6    Period Weeks    Status New    Target Date 09/15/20      PT LONG TERM GOAL #5   Title Patient to report tolerance for lifting activities at work with <3/10 pain.    Time 6    Period Weeks    Status New    Target Date 09/15/20                  Plan - 08/04/20 1158    Clinical Impression Statement Patient is a 49 y/o F presenting to OPPT with c/o chronic insidious B shoulder pain for the past year, R>L. Pain occurs over B anterior shoulders with radiation up to the posterior neck. Reports N/T in the same distribution, however, also endorses a hx of CTS. Aggravating factors include lifting, sidelying, or reaching behind her back. Notes improved symptoms with Cortisone injection. Also notes recent BP ratings as high as the 190s, however vitals WFL today. Patient today presenting with rounded posture, limited and painful B shoulder AROM, decreased B shoulder strength, and diffuse TTP over B shoulders and cervical musculature, R>L. Patient was educated on gentle AAROM and periscapular strengthening HEP- patient reported understanding. Would benefit from skilled PT services 1x/week for 6 weeks to address aforementioned impairments.    Personal Factors and Comorbidities Age;Comorbidity 3+;Fitness;Past/Current Experience;Time since onset of injury/illness/exacerbation;Finances    Comorbidities kidney stones, HTN, HLD, GERD, DM    Examination-Activity Limitations Sleep;Bed Mobility;Caring for Others;Carry;Dressing;Transfers;Hygiene/Grooming;Lift;Reach Overhead    Examination-Participation Restrictions Cleaning;Community Activity;Shop;Driving;Yard Work;Laundry;Meal Prep;Occupation    Stability/Clinical Decision Making Stable/Uncomplicated    Clinical Decision Making  Low    Rehab Potential Good    PT Frequency 1x / week    PT Duration 6 weeks    PT Treatment/Interventions ADLs/Self Care Home Management;Cryotherapy;Electrical Stimulation;Iontophoresis 4mg /ml Dexamethasone;Moist Heat;Therapeutic exercise;Therapeutic activities;Functional mobility training;Ultrasound;Neuromuscular re-education;Patient/family education;Manual techniques;Vasopneumatic Device;Taping;Energy conservation;Dry needling;Passive range of motion    PT Next Visit Plan reassess HEP; shoulder FOTO; progress shoulder AAROM/AROM and strength    Consulted and Agree with Plan of Care Patient           Patient will benefit from skilled therapeutic intervention in order to improve the following deficits and impairments:  Hypomobility, Increased edema, Decreased activity tolerance, Decreased strength, Increased fascial restricitons, Pain, Impaired UE functional use, Increased muscle spasms, Improper body mechanics, Decreased range of motion, Postural dysfunction, Impaired flexibility  Visit Diagnosis: Chronic right shoulder pain  Stiffness of right shoulder, not elsewhere classified  Chronic left shoulder pain  Stiffness of left shoulder, not elsewhere classified  Muscle weakness (generalized)  Abnormal posture     Problem List Patient Active Problem List   Diagnosis Date Noted   Scapular dysfunction 06/01/2020   Acute pain of both shoulders 06/01/2020   Adhesive capsulitis of right shoulder 07/16/2019   Hydronephrosis of left kidney 08/11/2015   Ureteral calculus, left 08/11/2015   Panic attack 03/09/2015   Pelvic pain in female 03/09/2015  Polyuria 03/09/2015   Primary insomnia 03/09/2015   Closed fracture of lateral malleolus 01/20/2015   Sprain of left ankle 01/20/2015   Chronic pain syndrome 12/02/2014   Atypical chest pain 11/11/2014   Hypercalcemia 11/11/2014   Bilateral carpal tunnel syndrome 10/23/2014   Encounter for monitoring opioid  maintenance therapy 10/02/2014   Chronic constipation 07/15/2014   Diabetes mellitus type 2, controlled, without complications ([place]) 15/01/6978   Hypokalemia 07/15/2014   Polycystic ovarian syndrome 07/15/2014   Morbid obesity ([place]) 05/15/2014   Ovarian cyst 03/17/2014   Allergic rhinitis 12/31/2013   Hyposmolality and/or hyponatremia 12/31/2013   Iron deficiency anemia 12/31/2013   Leiomyoma of uterus 12/31/2013   Tobacco use disorder 12/31/2013   Urinary incontinence 12/31/2013   Excessive and frequent menstruation 09/18/2013   Malaise and fatigue 09/18/2013   Metrorrhagia 09/18/2013   Symptom associated with female genital organs 09/18/2013   Exostosis 08/26/2013   Essential hypertension 05/27/2013   Pain in joint involving lower leg 05/27/2013   Osteoarthritis of knee 05/27/2013   Cholelithiasis 03/11/2013   Edema 03/11/2013   Esophageal reflux 03/11/2013   Neck pain 03/11/2013   Backache 03/11/2013   Depressive disorder 02/16/2013   Dizziness 02/16/2013   Dysphagia 02/16/2013   Hypercholesterolemia 02/16/2013   Migraine 02/16/2013   Pain in thoracic spine 02/16/2013   Primary localized osteoarthrosis, lower leg 02/16/2013   Spasm of muscle 02/16/2013   Ventral hernia 02/16/2013     Janene Harvey, PT, DPT 08/04/20 12:08 PM   [facility] [address]  [place], [place], [address] Phone: [phone]   Fax:  [phone]  Name: Soleia Badolato MRN: [id number] Date of Birth: 1971-06-01

## 2020-08-10 ENCOUNTER — Ambulatory Visit: Payer: Self-pay

## 2020-08-18 ENCOUNTER — Ambulatory Visit: Payer: Self-pay | Attending: Family Medicine | Admitting: Physical Therapy

## 2020-08-18 ENCOUNTER — Other Ambulatory Visit: Payer: Self-pay

## 2020-08-18 ENCOUNTER — Encounter: Payer: Self-pay | Admitting: Physical Therapy

## 2020-08-18 VITALS — BP 145/84 | HR 74

## 2020-08-18 DIAGNOSIS — M25511 Pain in right shoulder: Secondary | ICD-10-CM | POA: Insufficient documentation

## 2020-08-18 DIAGNOSIS — G8929 Other chronic pain: Secondary | ICD-10-CM | POA: Insufficient documentation

## 2020-08-18 DIAGNOSIS — M25512 Pain in left shoulder: Secondary | ICD-10-CM | POA: Insufficient documentation

## 2020-08-18 DIAGNOSIS — M25611 Stiffness of right shoulder, not elsewhere classified: Secondary | ICD-10-CM | POA: Insufficient documentation

## 2020-08-18 DIAGNOSIS — R293 Abnormal posture: Secondary | ICD-10-CM | POA: Insufficient documentation

## 2020-08-18 DIAGNOSIS — M6281 Muscle weakness (generalized): Secondary | ICD-10-CM | POA: Insufficient documentation

## 2020-08-18 DIAGNOSIS — M25612 Stiffness of left shoulder, not elsewhere classified: Secondary | ICD-10-CM | POA: Insufficient documentation

## 2020-08-18 NOTE — Therapy (Addendum)
[facility] [address]  [place], [place], [address] Phone: [phone]   Fax:  [phone]  Physical Therapy Treatment  Patient Details  Name: Kristine Bell MRN: [id number] Date of Birth: Sep 11, 1971 Referring Provider (PT): Clearance Coots, MD   Encounter Date: 08/18/2020   PT End of Session - 08/18/20 1012    Visit Number 2    Number of Visits 7    Date for PT Re-Evaluation 09/15/20    Authorization Type Applied for [facility]    PT Start Time [phone]    PT Stop Time 1011    PT Time Calculation (min) 40 min    Activity Tolerance Patient tolerated treatment well    Behavior During Therapy [facility] for tasks assessed/performed           Past Medical History:  Diagnosis Date  . Diabetes mellitus without complication ([place])   . GERD (gastroesophageal reflux disease)   . Hyperlipidemia   . Hypertension   . Kidney stone   . Kidney stones   . Obesity     Past Surgical History:  Procedure Laterality Date  . ABLATION ON ENDOMETRIOSIS    . HERNIA REPAIR    . LITHOTRIPSY    . TUBAL LIGATION      Vitals:   08/18/20 0932  BP: (!) 145/84  Pulse: 74  SpO2: 98%     Subjective Assessment - 08/18/20 0934    Subjective Shoulders have been feeling a little better. HEP going well- denies questions/concerns.    Pertinent History kidney stones, HTN, HLD, GERD, DM    Diagnostic tests 07/15/19 R shoulder xray: No acute osseous findings. Inferior subluxation of the humeral head relative to the glenoid. This finding can be seen with joint effusion or ligamentous laxity    Patient Stated Goals decrease pain    Currently in Pain? Yes    Pain Score 5     Pain Location Shoulder    Pain Orientation Right;Left    Pain Descriptors / Indicators Aching    Pain Type Chronic pain                             [facility] Adult PT Treatment/Exercise - 08/18/20 0001      Exercises   Exercises Shoulder;Neck       Shoulder Exercises: Supine   External Rotation AAROM;Right;Left;10 reps    External Rotation Limitations shoulders in neutral    Flexion AAROM;Right;Left;10 reps    Flexion Limitations supine with wand   cues to avoid pushing into excessive pain    ABduction AAROM;Right;Left;5 reps    ABduction Limitations supine with wand   correction of hand position and alignment     Shoulder Exercises: Sidelying   External Rotation AROM;Strengthening;Right;Left;10 reps    External Rotation Limitations 10x 0#, 10x 1#; elbow at side   cues to keep wrist neutral   ABduction AROM;Right;Left;10 reps    ABduction Limitations thumb up; to tolerance      Shoulder Exercises: Standing   Extension Strengthening;Both;10 reps;Theraband    Theraband Level (Shoulder Extension) Level 2 (Red)    Extension Limitations good scap retraction    Row Strengthening;Both;10 reps;Theraband    Theraband Level (Shoulder Row) Level 2 (Red)    Row Limitations 2x10   good form after review     Shoulder Exercises: Pulleys   Flexion 3 minutes    Flexion Limitations to tolerance  Scaption 3 minutes    Scaption Limitations to tolerance      Neck Exercises: Stretches   Upper Trapezius Stretch Right;Left;1 rep;30 seconds    Upper Trapezius Stretch Limitations with strap assist    Levator Stretch Right;Left;1 rep;30 seconds    Levator Stretch Limitations with strap assist                  PT Education - 08/18/20 1012    Education Details update to HEP    Person(s) Educated Patient    Methods Explanation;Demonstration;Tactile cues;Verbal cues;Handout    Comprehension Returned demonstration;Verbalized understanding            PT Short Term Goals - 08/18/20 1013      PT SHORT TERM GOAL #1   Title Patient to be independent with initial HEP.    Time 2    Period Weeks    Status On-going    Target Date 08/18/20             PT Long Term Goals - 08/18/20 1013      PT LONG TERM GOAL #1   Title Patient to  be independent with advanced HEP.    Time 6    Period Weeks    Status On-going      PT LONG TERM GOAL #2   Title Patient to demonstrate B shoulder strength >/=4+/5.    Time 6    Period Weeks    Status On-going      PT LONG TERM GOAL #3   Title Patient to demonstrate B shoulder AROM WFL and without pain limiting.    Time 6    Period Weeks    Status On-going      PT LONG TERM GOAL #4   Title Patient to report 70% improvement in sleeping tolerance d/t improvement in shoulder pain.    Time 6    Period Weeks    Status On-going      PT LONG TERM GOAL #5   Title Patient to report tolerance for lifting activities at work with <3/10 pain.    Time 6    Period Weeks    Status On-going                 Plan - 08/18/20 1012    Clinical Impression Statement Patient reporting improvement in shoulder pain at start of session and denies questions/concerns. Reviewed AAROM HEP, with patient requiring intermittent correction of alignment and form. Initiated sidelying shoulder AROM and strengthening, with good tolerance. Patient did report fatigue with addition of weight with ER today, but without pain. Proceeded with gentle cervical stretching with strap assistance to avoid shoulder elevation. Patient able to perform periscapular strengthening with banded resistance with good carryover after review of form. Patient reported no increased pain at end of session and reported understanding of HEP update.    Comorbidities kidney stones, HTN, HLD, GERD, DM    PT Treatment/Interventions ADLs/Self Care Home Management;Cryotherapy;Electrical Stimulation;Iontophoresis 32m/ml Dexamethasone;Moist Heat;Therapeutic exercise;Therapeutic activities;Functional mobility training;Ultrasound;Neuromuscular re-education;Patient/family education;Manual techniques;Vasopneumatic Device;Taping;Energy conservation;Dry needling;Passive range of motion    PT Next Visit Plan shoulder FOTO; progress shoulder AAROM/AROM and  strength    Consulted and Agree with Plan of Care Patient           Patient will benefit from skilled therapeutic intervention in order to improve the following deficits and impairments:  Hypomobility, Increased edema, Decreased activity tolerance, Decreased strength, Increased fascial restricitons, Pain, Impaired UE functional use, Increased muscle spasms, Improper body mechanics,  Decreased range of motion, Postural dysfunction, Impaired flexibility  Visit Diagnosis: Chronic right shoulder pain  Stiffness of right shoulder, not elsewhere classified  Chronic left shoulder pain  Stiffness of left shoulder, not elsewhere classified  Muscle weakness (generalized)  Abnormal posture     Problem List Patient Active Problem List   Diagnosis Date Noted  . Scapular dysfunction 06/01/2020  . Acute pain of both shoulders 06/01/2020  . 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 Hydronephrosis of left kidney 08/11/2015  . Ureteral calculus, left 08/11/2015  . Panic attack 03/09/2015  . Pelvic pain in female 03/09/2015  . Polyuria 03/09/2015  . 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 Closed fracture of lateral malleolus 01/20/2015  . Sprain of left ankle 01/20/2015  . Chronic pain syndrome 12/02/2014  . Atypical chest pain 11/11/2014  . Hypercalcemia 11/11/2014  . Bilateral carpal tunnel syndrome 10/23/2014  . Encounter for monitoring opioid maintenance therapy 10/02/2014  . Chronic constipation 07/15/2014  . Diabetes mellitus type 2, controlled, without complications ([place]) 41/96/2229  . Hypokalemia 07/15/2014  . Polycystic ovarian syndrome 07/15/2014  . Morbid obesity ([place]) 05/15/2014  . Ovarian cyst 03/17/2014  . Allergic rhinitis 12/31/2013  . Hyposmolality and/or hyponatremia 12/31/2013  . Iron deficiency anemia 12/31/2013  . Leiomyoma of uterus 12/31/2013  . Tobacco use disorder 12/31/2013  . Urinary incontinence 12/31/2013  . Excessive and frequent menstruation  09/18/2013  . Malaise and fatigue 09/18/2013  . Metrorrhagia 09/18/2013  . Symptom associated with female genital organs 09/18/2013  . Exostosis 08/26/2013  . Essential hypertension 05/27/2013  . Pain in joint involving lower leg 05/27/2013  . Osteoarthritis of knee 05/27/2013  . Cholelithiasis 03/11/2013  . Edema 03/11/2013  . Esophageal reflux 03/11/2013  . Neck pain 03/11/2013  . Backache 03/11/2013  . Depressive disorder 02/16/2013  . Dizziness 02/16/2013  . Dysphagia 02/16/2013  . Hypercholesterolemia 02/16/2013  . Migraine 02/16/2013  . Pain in thoracic spine 02/16/2013  . Primary localized osteoarthrosis, lower leg 02/16/2013  . Spasm of muscle 02/16/2013  . Ventral hernia 02/16/2013     Janene Harvey, PT, DPT 08/18/20 10:14 AM   [facility] [address]  [place], [place], [address] Phone: [phone]   Fax:  [phone]  Name: Kristine Bell MRN: [id number] Date of Birth: 09-04-1971  PHYSICAL THERAPY DISCHARGE SUMMARY  Visits from Start of Care: 2  Current functional level related to goals / functional outcomes: Unable to assess; patient did not return after undergoing an unrelated surgery    Remaining deficits: Unable to assess   Education / Equipment: HEP  Plan: Patient agrees to discharge.  Patient goals were not met. Patient is being discharged due to a change in medical status.  ?????     Janene Harvey, PT, DPT 09/29/20 2:21 PM

## 2020-08-24 ENCOUNTER — Emergency Department (HOSPITAL_BASED_OUTPATIENT_CLINIC_OR_DEPARTMENT_OTHER): Payer: Self-pay

## 2020-08-24 ENCOUNTER — Emergency Department (HOSPITAL_BASED_OUTPATIENT_CLINIC_OR_DEPARTMENT_OTHER)
Admission: EM | Admit: 2020-08-24 | Discharge: 2020-08-24 | Disposition: A | Discharge status: Home / Self Care | Payer: Medicaid Other | Attending: Emergency Medicine | Admitting: Emergency Medicine

## 2020-08-24 ENCOUNTER — Other Ambulatory Visit: Payer: Self-pay

## 2020-08-24 ENCOUNTER — Encounter (HOSPITAL_BASED_OUTPATIENT_CLINIC_OR_DEPARTMENT_OTHER): Payer: Self-pay | Admitting: *Deleted

## 2020-08-24 DIAGNOSIS — N200 Calculus of kidney: Secondary | ICD-10-CM | POA: Insufficient documentation

## 2020-08-24 DIAGNOSIS — I10 Essential (primary) hypertension: Secondary | ICD-10-CM | POA: Insufficient documentation

## 2020-08-24 DIAGNOSIS — F17210 Nicotine dependence, cigarettes, uncomplicated: Secondary | ICD-10-CM | POA: Insufficient documentation

## 2020-08-24 DIAGNOSIS — Z79899 Other long term (current) drug therapy: Secondary | ICD-10-CM | POA: Insufficient documentation

## 2020-08-24 DIAGNOSIS — Z7984 Long term (current) use of oral hypoglycemic drugs: Secondary | ICD-10-CM | POA: Insufficient documentation

## 2020-08-24 DIAGNOSIS — E119 Type 2 diabetes mellitus without complications: Secondary | ICD-10-CM | POA: Insufficient documentation

## 2020-08-24 DIAGNOSIS — Z7982 Long term (current) use of aspirin: Secondary | ICD-10-CM | POA: Insufficient documentation

## 2020-08-24 DIAGNOSIS — K219 Gastro-esophageal reflux disease without esophagitis: Secondary | ICD-10-CM | POA: Insufficient documentation

## 2020-08-24 LAB — URINALYSIS, MICROSCOPIC (REFLEX)

## 2020-08-24 LAB — CBC WITH DIFFERENTIAL/PLATELET
Abs Immature Granulocytes: 0.1 10*3/uL — ABNORMAL HIGH (ref 0.00–0.07)
Basophils Absolute: 0.1 10*3/uL (ref 0.0–0.1)
Basophils Relative: 1 %
Eosinophils Absolute: 0.1 10*3/uL (ref 0.0–0.5)
Eosinophils Relative: 1 %
HCT: 42.8 % (ref 36.0–46.0)
Hemoglobin: 13.5 g/dL (ref 12.0–15.0)
Immature Granulocytes: 1 %
Lymphocytes Relative: 13 %
Lymphs Abs: 2 10*3/uL (ref 0.7–4.0)
MCH: 26.4 pg (ref 26.0–34.0)
MCHC: 31.5 g/dL (ref 30.0–36.0)
MCV: 83.6 fL (ref 80.0–100.0)
Monocytes Absolute: 1.1 10*3/uL — ABNORMAL HIGH (ref 0.1–1.0)
Monocytes Relative: 7 %
Neutro Abs: 12.5 10*3/uL — ABNORMAL HIGH (ref 1.7–7.7)
Neutrophils Relative %: 77 %
Platelets: 317 10*3/uL (ref 150–400)
RBC: 5.12 MIL/uL — ABNORMAL HIGH (ref 3.87–5.11)
RDW: 15.5 % (ref 11.5–15.5)
WBC: 15.9 10*3/uL — ABNORMAL HIGH (ref 4.0–10.5)
nRBC: 0 % (ref 0.0–0.2)

## 2020-08-24 LAB — COMPREHENSIVE METABOLIC PANEL
ALT: 22 U/L (ref 0–44)
AST: 20 U/L (ref 15–41)
Albumin: 3.9 g/dL (ref 3.5–5.0)
Alkaline Phosphatase: 127 U/L — ABNORMAL HIGH (ref 38–126)
Anion gap: 8 (ref 5–15)
BUN: 12 mg/dL (ref 6–20)
CO2: 27 mmol/L (ref 22–32)
Calcium: 10.6 mg/dL — ABNORMAL HIGH (ref 8.9–10.3)
Chloride: 102 mmol/L (ref 98–111)
Creatinine, Ser: 1.02 mg/dL — ABNORMAL HIGH (ref 0.44–1.00)
GFR, Estimated: >60 mL/min (ref >60)
Glucose, Bld: 143 mg/dL — ABNORMAL HIGH (ref 70–99)
Potassium: 3.8 mmol/L (ref 3.5–5.1)
Sodium: 137 mmol/L (ref 135–145)
Total Bilirubin: 0.6 mg/dL (ref 0.3–1.2)
Total Protein: 7.2 g/dL (ref 6.5–8.1)

## 2020-08-24 LAB — URINALYSIS, ROUTINE W REFLEX MICROSCOPIC
Bilirubin Urine: NEGATIVE
Glucose, UA: NEGATIVE mg/dL
Ketones, ur: NEGATIVE mg/dL
Nitrite: NEGATIVE
Protein, ur: NEGATIVE mg/dL
Specific Gravity, Urine: 1.02 (ref 1.005–1.030)
pH: 7 (ref 5.0–8.0)

## 2020-08-24 MED ORDER — HYDROMORPHONE HCL 1 MG/ML IJ SOLN
1.0000 mg | Freq: Once | INTRAMUSCULAR | Status: AC
  Administered 2020-08-24: 1 mg via INTRAVENOUS
  Filled 2020-08-24: qty 1

## 2020-08-24 MED ORDER — ONDANSETRON 4 MG PO TBDP: 4.0000 mg | ORAL_TABLET | Freq: Three times a day (TID) | ORAL | 0 refills | Status: DC | PRN

## 2020-08-24 MED ORDER — MORPHINE SULFATE (PF) 4 MG/ML IV SOLN
4.0000 mg | Freq: Once | INTRAVENOUS | Status: AC
  Administered 2020-08-24: 4 mg via INTRAVENOUS
  Filled 2020-08-24: qty 1

## 2020-08-24 MED ORDER — OXYCODONE-ACETAMINOPHEN 5-325 MG PO TABS: 1.0000 | ORAL_TABLET | Freq: Four times a day (QID) | ORAL | 0 refills | Status: DC | PRN

## 2020-08-24 MED ORDER — ONDANSETRON HCL 4 MG/2ML IJ SOLN
4.0000 mg | Freq: Once | INTRAMUSCULAR | Status: AC
  Administered 2020-08-24: 4 mg via INTRAVENOUS
  Filled 2020-08-24: qty 2

## 2020-08-24 MED ORDER — SODIUM CHLORIDE 0.9 % IV BOLUS
1000.0000 mL | Freq: Once | INTRAVENOUS | Status: AC
  Administered 2020-08-24: 1000 mL via INTRAVENOUS

## 2020-08-24 MED ORDER — KETOROLAC TROMETHAMINE 15 MG/ML IJ SOLN
15.0000 mg | Freq: Once | INTRAMUSCULAR | Status: AC
  Administered 2020-08-24: 15 mg via INTRAVENOUS
  Filled 2020-08-24: qty 1

## 2020-08-24 NOTE — ED Provider Notes (Signed)
[facility] DEPARTMENT Provider Note   CSN: [id number] Arrival date & time: 08/24/20  1219     History Chief Complaint  Patient presents with  . Emesis  . Flank Pain    Kristine Bell is a 49 y.o. female with past medical history significant for diabetes, hyperlipidemia, hypertension, kidney stones, obesity.   HPI Patient presents to emergency department today with chief complaint of progressively worsening right flank pain x 1 day. She describes pain as sharp. Pain is constant and radiates to her groin. She has associated nausea and emesis. Reports 6 episodes of non bloody non bilious prior to arrival. She rates pain 9/10 in severity. She did not take anything for pain prior to arrival. Denies fever, chills, chest pain,dysuira, urinary frequency, gross hematuria, abnormal vaginal bleeding, vaginal discharge. Feels like kidney stones she has had in the past. Stones in the past required stents as they measured >10 mm. No LMP. Had ablation. She has seen urology at [facility] in the past, last seen in 2018.  Past Medical History:  Diagnosis Date  . Diabetes mellitus without complication ([place])   . GERD (gastroesophageal reflux disease)   . Hyperlipidemia   . Hypertension   . Kidney stone   . Kidney stones   . Obesity     Patient Active Problem List   Diagnosis Date Noted  . Scapular dysfunction 06/01/2020  . Acute pain of both shoulders 06/01/2020  . 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 Hydronephrosis of left kidney 08/11/2015  . Ureteral calculus, left 08/11/2015  . Panic attack 03/09/2015  . Pelvic pain in female 03/09/2015  . Polyuria 03/09/2015  . 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 Closed fracture of lateral malleolus 01/20/2015  . Sprain of left ankle 01/20/2015  . Chronic pain syndrome 12/02/2014  . Atypical chest pain 11/11/2014  . Hypercalcemia 11/11/2014  . Bilateral carpal tunnel syndrome 10/23/2014  . Encounter for  monitoring opioid maintenance therapy 10/02/2014  . Chronic constipation 07/15/2014  . Diabetes mellitus type 2, controlled, without complications ([place]) 29/93/7169  . Hypokalemia 07/15/2014  . Polycystic ovarian syndrome 07/15/2014  . Morbid obesity ([place]) 05/15/2014  . Ovarian cyst 03/17/2014  . Allergic rhinitis 12/31/2013  . Hyposmolality and/or hyponatremia 12/31/2013  . Iron deficiency anemia 12/31/2013  . Leiomyoma of uterus 12/31/2013  . Tobacco use disorder 12/31/2013  . Urinary incontinence 12/31/2013  . Excessive and frequent menstruation 09/18/2013  . Malaise and fatigue 09/18/2013  . Metrorrhagia 09/18/2013  . Symptom associated with female genital organs 09/18/2013  . Exostosis 08/26/2013  . Essential hypertension 05/27/2013  . Pain in joint involving lower leg 05/27/2013  . Osteoarthritis of knee 05/27/2013  . Cholelithiasis 03/11/2013  . Edema 03/11/2013  . Esophageal reflux 03/11/2013  . Neck pain 03/11/2013  . Backache 03/11/2013  . Depressive disorder 02/16/2013  . Dizziness 02/16/2013  . Dysphagia 02/16/2013  . Hypercholesterolemia 02/16/2013  . Migraine 02/16/2013  . Pain in thoracic spine 02/16/2013  . Primary localized osteoarthrosis, lower leg 02/16/2013  . Spasm of muscle 02/16/2013  . Ventral hernia 02/16/2013    Past Surgical History:  Procedure Laterality Date  . ABLATION ON ENDOMETRIOSIS    . HERNIA REPAIR    . LITHOTRIPSY    . TUBAL LIGATION       OB History   No obstetric history on file.     Family History  Problem Relation Age of Onset  . Hyperlipidemia Father   . Hypertension Father   .  Hypertension Brother   . Cervical cancer Maternal Grandmother   . Crohn's disease Brother     Social History   Tobacco Use  . Smoking status: Current Every Day Smoker    Packs/day: 0.50    Types: Cigarettes  . Smokeless tobacco: Never Used  Substance Use Topics  . Alcohol use: No  . Drug use: No    Home Medications Prior to  Admission medications   Medication Sig Start Date End Date Taking? Authorizing Provider  aspirin 81 MG chewable tablet Chew by mouth.    [provider]  atorvastatin (LIPITOR) 10 MG tablet Take 1 tablet (10 mg total) by mouth daily. 09/25/19   Saguier, Percell Miller, PA-C  benzonatate (TESSALON) 100 MG capsule Take 1 capsule (100 mg total) by mouth every 8 (eight) hours. 12/18/19   Tedd Sias, PA  buPROPion (WELLBUTRIN XL) 150 MG 24 hr tablet 1 tab po q day 10/09/19   Saguier, Percell Miller, PA-C  chlorthalidone (HYGROTON) 25 MG tablet Take 1 tablet by mouth once daily 03/02/20   Saguier, Percell Miller, PA-C  cyclobenzaprine (FLEXERIL) 10 MG tablet Take 1 tablet (10 mg total) by mouth at bedtime. 11/26/19   Saguier, Percell Miller, PA-C  diclofenac sodium (VOLTAREN) 1 % GEL Apply 2 g topically 4 (four) times daily as needed. 07/15/19   Long, Wonda Olds, MD  losartan (COZAAR) 50 MG tablet Take 1 tablet (50 mg total) by mouth daily. 09/25/19   Saguier, Percell Miller, PA-C  metFORMIN (GLUCOPHAGE) 500 MG tablet Take 1 tablet (500 mg total) by mouth 2 (two) times daily with a meal. 09/25/19   Saguier, Percell Miller, PA-C  methylPREDNISolone (MEDROL) 4 MG tablet 5 tab po am and pm 4 tab po am and pm 3 tab po am and pm 2 tab po am and pm 1 tab po am and pm 12/03/19   Saguier, Percell Miller, PA-C  ondansetron (ZOFRAN ODT) 4 MG disintegrating tablet Take 1 tablet (4 mg total) by mouth every 8 (eight) hours as needed for nausea or vomiting. 08/24/20   Barrie Folk, PA-C  oxyCODONE-acetaminophen (PERCOCET/ROXICET) 5-325 MG tablet Take 1-2 tablets by mouth every 6 (six) hours as needed for severe pain. 08/24/20   Walisiewicz, Harley Hallmark, PA-C  SUMAtriptan (IMITREX) 50 MG tablet Take 1 tablet (50 mg total) by mouth every 2 (two) hours as needed for migraine. May repeat in 2 hours if headache persists or recurs. 11/27/19   Saguier, Percell Miller, PA-C  traZODone (DESYREL) 50 MG tablet Take 0.5-1 tablets (25-50 mg total) by mouth at bedtime as needed for  sleep. 10/09/19   Saguier, Percell Miller, PA-C    Allergies    Naproxen sodium, Naproxen, Sertraline, and Tramadol  Review of Systems   Review of Systems All other systems are reviewed and are negative for acute change except as noted in the HPI.  Physical Exam Updated Vital Signs BP (!) 157/81   Pulse 81   Temp 98.1 F (36.7 C) (Oral)   Resp 18   Ht 5\' 5"  (1.651 m)   Wt (!) 160.3 kg   SpO2 100%   BMI 58.79 kg/m   Physical Exam Vitals and nursing note reviewed.  Constitutional:      General: She is not in acute distress.    Appearance: She is obese. She is not ill-appearing.  HENT:     Head: Normocephalic and atraumatic.     Right Ear: Tympanic membrane and external ear normal.     Left Ear: Tympanic membrane and external ear normal.  Nose: Nose normal.     Mouth/Throat:     Mouth: Mucous membranes are moist.     Pharynx: Oropharynx is clear.  Eyes:     General: No scleral icterus.       Right eye: No discharge.        Left eye: No discharge.     Extraocular Movements: Extraocular movements intact.     Conjunctiva/sclera: Conjunctivae normal.     Pupils: Pupils are equal, round, and reactive to light.  Neck:     Vascular: No JVD.  Cardiovascular:     Rate and Rhythm: Normal rate and regular rhythm.     Pulses: Normal pulses.          Radial pulses are 2+ on the right side and 2+ on the left side.     Heart sounds: Normal heart sounds.  Pulmonary:     Comments: Lungs clear to auscultation in all fields. Symmetric chest rise. No wheezing, rales, or rhonchi. Abdominal:     Tenderness: There is right CVA tenderness.     Comments: Abdomen is soft, non-distended. Tender to palpation of RUQ. No rigidity, no guarding. No peritoneal signs.  Musculoskeletal:        General: Normal range of motion.     Cervical back: Normal range of motion.  Skin:    General: Skin is warm and dry.     Capillary Refill: Capillary refill takes less than 2 seconds.  Neurological:     Mental  Status: She is oriented to person, place, and time.     GCS: GCS eye subscore is 4. GCS verbal subscore is 5. GCS motor subscore is 6.     Comments: Fluent speech, no facial droop.  Psychiatric:        Behavior: Behavior normal.     ED Results / Procedures / Treatments   Labs (all labs ordered are listed, but only abnormal results are displayed) Labs Reviewed  URINALYSIS, ROUTINE W REFLEX MICROSCOPIC - Abnormal; Notable for the following components:      Result Value   Hgb urine dipstick SMALL (*)    Leukocytes,Ua TRACE (*)    All other components within normal limits  CBC WITH DIFFERENTIAL/PLATELET - Abnormal; Notable for the following components:   WBC 15.9 (*)    RBC 5.12 (*)    Neutro Abs 12.5 (*)    Monocytes Absolute 1.1 (*)    Abs Immature Granulocytes 0.10 (*)    All other components within normal limits  COMPREHENSIVE METABOLIC PANEL - Abnormal; Notable for the following components:   Glucose, Bld 143 (*)    Creatinine, Ser 1.02 (*)    Calcium 10.6 (*)    Alkaline Phosphatase 127 (*)    All other components within normal limits  URINALYSIS, MICROSCOPIC (REFLEX) - Abnormal; Notable for the following components:   Bacteria, UA MANY (*)    All other components within normal limits    EKG None  Radiology CT Renal Stone Study  Result Date: 08/24/2020 CLINICAL DATA:  Flank pain right-sided with vomiting EXAM: CT ABDOMEN AND PELVIS WITHOUT CONTRAST TECHNIQUE: Multidetector CT imaging of the abdomen and pelvis was performed following the standard protocol without IV contrast. COMPARISON:  CT 09/15/2018 FINDINGS: Lower chest: Lung bases demonstrate no acute consolidation or effusion. Borderline to mild cardiomegaly. Hepatobiliary: No focal liver abnormality is seen. No gallstones, gallbladder wall thickening, or biliary dilatation. Pancreas: Unremarkable. No pancreatic ductal dilatation or surrounding inflammatory changes. Spleen: Normal in size without focal abnormality.  Adrenals/Urinary Tract: Adrenal glands are unremarkable. Mild right perinephric fat stranding. Moderate right hydronephrosis, secondary to a 15 by 17 mm stone at the right UPJ. Urinary bladder is unremarkable. Stomach/Bowel: Stomach is within normal limits. Appendix appears normal. No evidence of bowel wall thickening, distention, or inflammatory changes. Minimal diverticular disease of the sigmoid colon. Vascular/Lymphatic: Mild aortic atherosclerosis. No aneurysm. No suspicious nodes Reproductive: No adnexal masses. Lobulated left fundal uterine contour possibly due to fibroids. Other: Negative for free air or free fluid Musculoskeletal: No acute or significant osseous findings. IMPRESSION: Moderate right hydronephrosis, secondary to a 15 x 17 mm stone at the right UPJ. Probable uterine fibroids Sigmoid colon diverticular disease without acute inflammatory change. Aortic Atherosclerosis (ICD10-I70.0). Electronically Signed   By: Donavan Foil M.D.   On: 08/24/2020 15:42    Procedures Procedures (including critical care time)  Medications Ordered in ED Medications  morphine 4 MG/ML injection 4 mg (4 mg Intravenous Given 08/24/20 1520)  ondansetron (ZOFRAN) injection 4 mg (4 mg Intravenous Given 08/24/20 1519)  sodium chloride 0.9 % bolus 1,000 mL (0 mLs Intravenous Stopped 08/24/20 1648)  ketorolac (TORADOL) 15 MG/ML injection 15 mg (15 mg Intravenous Given 08/24/20 1732)  HYDROmorphone (DILAUDID) injection 1 mg (1 mg Intravenous Given 08/24/20 1826)    ED Course  I have reviewed the triage vital signs and the nursing notes.  Pertinent labs & imaging results that were available during my care of the patient were reviewed by me and considered in my medical decision making (see chart for details).    MDM Rules/Calculators/A&P                         History provided by patient with additional history obtained from chart review.    49 yo female presenting with right flank pain. Afebrile, HDS,  no tachycardia. Non toxic appearing. Exam with right CVA tenderness and RUQ tenderness, no peritoneal signs.Pt has been diagnosed with a Kidney Stone via CT. CT viewed by me shows 15 x 17 mm stone at the right UPJ causing moderate right hydronephrosis. Given analgesic, anti-emetic, 1 L NS. Labs show leukocytosis 15.9, no anemia, slight bump in creatinine at 1.02, baseline appears to be around 0.7 based on chart review. No significant electrolyte derangement. UA with trace leukocytes, 11-20 WBC and many bacteria, small hemoglobinuria. She has no urinary symptoms.   She is patient of [facility] therefore contacted on call provider for the practice Dr. Amalia Hailey. He will see patient for close follow up this week. He agrees antibiotics are not necessary, leukocytosis is likely reactive. He also advises flomax will not be helpful in her case. Will discharge with a CD of her CT to help with follow up. Patient agrees with plan of care. Pain has significantly improved in the ED. She is tolerating PO intake. Serial abdominal exams are without peritoneal signs. She is tolerating PO intake. I have reviewed the PDMP during this encounter. Will discharge with pain medicine and antiemetics     The patient appears reasonably screened and/or stabilized for discharge and I doubt any other medical condition or other [facility] requiring further screening, evaluation, or treatment in the ED at this time prior to discharge. The patient is safe for discharge with strict return precautions discussed. Findings and plan of care discussed with supervising physician Dr. Karle Starch.  Portions of this note were generated with [employer]. Dictation errors may occur despite best attempts at proofreading.  Final Clinical Impression(s) / ED Diagnoses Final diagnoses:  Kidney stone    Rx / DC Orders ED Discharge Orders         Ordered    ondansetron (ZOFRAN ODT) 4 MG disintegrating tablet  Every 8 hours PRN         08/24/20 1824    oxyCODONE-acetaminophen (PERCOCET/ROXICET) 5-325 MG tablet  Every 6 hours PRN        08/24/20 1824           Barrie Folk, PA-C 08/24/20 1912    Truddie Hidden, MD 08/24/20 2116

## 2020-08-24 NOTE — ED Notes (Signed)
Patient transported to CT 

## 2020-08-24 NOTE — Discharge Instructions (Addendum)
I contacted on call urologist for the practice you saw in [facility].   The office staff should reach out to you tomorrow, but if you do not hear from them you can call the office number listed in your discharge paperwork.  Prescriptions sent to your pharmacy for percocet and zofran. Take as needed. Do not drive or work when taking as pain medicine can make you drowsy.    You can take ibuprofen for mild pain as needed. Take as directed on the bottle.  Return to the emergency department if you have uncontrollable pain and vomiting or if you develop fever over 100.4.

## 2020-08-24 NOTE — ED Triage Notes (Signed)
Last night she started having pain in her right flank. Vomiting. Hx of kidney stones.

## 2020-08-24 NOTE — ED Notes (Signed)
ED Provider at bedside. 

## 2020-08-25 ENCOUNTER — Ambulatory Visit: Payer: Self-pay

## 2020-08-25 ENCOUNTER — Telehealth (HOSPITAL_COMMUNITY): Payer: Self-pay | Admitting: Physician Assistant

## 2020-08-25 MED ORDER — OXYCODONE-ACETAMINOPHEN 5-325 MG PO TABS: 1.0000 | ORAL_TABLET | ORAL | 0 refills | Status: DC | PRN

## 2020-08-25 NOTE — Telephone Encounter (Signed)
I was contacted by case management this morning to resend prescription for percocet as pharmacy would not fill the one sent at discharge yesterday because dosing was 1-2 tablets.  Prescription from yesterday was discontinued. Ne prescription sent.

## 2020-09-08 ENCOUNTER — Ambulatory Visit: Payer: Self-pay

## 2020-09-15 ENCOUNTER — Encounter: Payer: Medicaid Other | Admitting: Physical Therapy

## 2020-10-29 ENCOUNTER — Other Ambulatory Visit: Payer: Self-pay

## 2020-10-29 ENCOUNTER — Ambulatory Visit: Payer: Self-pay

## 2020-10-29 ENCOUNTER — Ambulatory Visit (INDEPENDENT_AMBULATORY_CARE_PROVIDER_SITE_OTHER): Payer: Medicaid Other | Admitting: Family Medicine

## 2020-10-29 DIAGNOSIS — M778 Other enthesopathies, not elsewhere classified: Secondary | ICD-10-CM

## 2020-10-29 MED ORDER — TRIAMCINOLONE ACETONIDE 40 MG/ML IJ SUSP
40.0000 mg | Freq: Once | INTRAMUSCULAR | Status: AC
  Administered 2020-10-29: 40 mg via INTRA_ARTICULAR

## 2020-10-29 NOTE — Progress Notes (Signed)
Kristine Bell - 50 y.o. female MRN [id number]  Date of birth: April 30, 1971  SUBJECTIVE:  Including CC & ROS.  No chief complaint on file.   Kristine Bell is a 50 y.o. female that is presenting with acute on chronic right shoulder pain.  This pain has been ongoing for about 2 years now.  She has tried physical therapy injections and medications.  The pain is intermittent in nature.  Can be severe at times.  She notices it posteriorly.   Review of Systems See HPI   HISTORY: Past Medical, Surgical, Social, and Family History Reviewed & Updated per EMR.   Pertinent Historical Findings include:  Past Medical History:  Diagnosis Date  . Diabetes mellitus without complication ([place])   . GERD (gastroesophageal reflux disease)   . Hyperlipidemia   . Hypertension   . Kidney stone   . Kidney stones   . Obesity     Past Surgical History:  Procedure Laterality Date  . ABLATION ON ENDOMETRIOSIS    . HERNIA REPAIR    . LITHOTRIPSY    . TUBAL LIGATION      Family History  Problem Relation Age of Onset  . Hyperlipidemia Father   . Hypertension Father   . Hypertension Brother   . Cervical cancer Maternal Grandmother   . Crohn's disease Brother     Social History   Socioeconomic History  . Marital status: Single    Spouse name: Not on file  . Number of children: Not on file  . Years of education: Not on file  . Highest education level: Not on file  Occupational History  . Not on file  Tobacco Use  . Smoking status: Current Every Day Smoker    Packs/day: 0.50    Types: Cigarettes  . Smokeless tobacco: Never Used  Substance and Sexual Activity  . Alcohol use: No  . Drug use: No  . Sexual activity: Not on file  Other Topics Concern  . Not on file  Social History Narrative  . Not on file   Social Determinants of Health   Financial Resource Strain: Not on file  Food Insecurity: Not on file  Transportation Needs: Not on file  Physical Activity: Not on file  Stress: Not on  file  Social Connections: Not on file  Intimate Partner Violence: Not on file     PHYSICAL EXAM:  VS: BP (!) 146/80 (BP Location: Left Arm, Patient Position: Sitting, Cuff Size: Large)   Ht 5\' 5"  (1.651 m)   Wt (!) 353 lb (160.1 kg)   BMI 58.74 kg/m  Physical Exam Gen: NAD, alert, cooperative with exam, well-appearing MSK:  Right shoulder: Limited range of motion in abduction and external rotation. Normal range of motion internal rotation. Normal strength resistance. Positive O'Brien's test. Neurovascular intact   Aspiration/Injection Procedure Note Dawn Convery 08/24/1971  Procedure: Injection Indications: Right shoulder pain  Procedure Details Consent: Risks of procedure as well as the alternatives and risks of each were explained to the (patient/caregiver).  Consent for procedure obtained. Time Out: Verified patient identification, verified procedure, site/side was marked, verified correct patient position, special equipment/implants available, medications/allergies/relevent history reviewed, required imaging and test results available.  Performed.  The area was cleaned with iodine and alcohol swabs.    The right glenohumeral joint was injected using 3 cc of 1% lidocaine on a 3-1/2 inch 22-gauge needle.  The syringe was switched and a mixture containing 1 cc's of 40 mg Kenalog and 4 cc's of 0.25% bupivacaine was  injected.  Ultrasound was used. Images were obtained in short views showing the injection.     A sterile dressing was applied.  Patient did tolerate procedure well.     ASSESSMENT & PLAN:   Capsulitis of right shoulder Recurrent in nature.  She does get relief with the glenohumeral injection.  Seems to be associated with either degenerative changes or labral issue of the shoulder itself. -Counseled on home exercise therapy and supportive care. -She is scheduled to restart physical therapy. -Glenohumeral injection. -MRI to evaluate for labral tear.

## 2020-10-29 NOTE — Patient Instructions (Signed)
Good to see you Please use ice as needed  Please try physical therapy  Please call [phone] to schedule the MRI   Please send me a message in MyChart with any questions or updates.  We will setup a virtual visit once the MRI is resulted.   --Dr. Raeford Razor

## 2020-10-29 NOTE — Assessment & Plan Note (Signed)
Recurrent in nature.  She does get relief with the glenohumeral injection.  Seems to be associated with either degenerative changes or labral issue of the shoulder itself. -Counseled on home exercise therapy and supportive care. -She is scheduled to restart physical therapy. -Glenohumeral injection. -MRI to evaluate for labral tear.

## 2020-10-29 NOTE — Addendum Note (Signed)
Addended by: Cresenciano Lick on: 10/29/2020 11:42 AM   Modules accepted: Orders

## 2020-11-03 ENCOUNTER — Ambulatory Visit (INDEPENDENT_AMBULATORY_CARE_PROVIDER_SITE_OTHER): Payer: Self-pay | Admitting: Medical

## 2020-11-03 ENCOUNTER — Other Ambulatory Visit: Payer: Self-pay

## 2020-11-03 ENCOUNTER — Telehealth: Payer: Self-pay | Admitting: Medical

## 2020-11-03 VITALS — BP 155/82 | HR 69 | Temp 98.1°F | Resp 20 | Ht 65.0 in | Wt 346.0 lb

## 2020-11-03 DIAGNOSIS — E785 Hyperlipidemia, unspecified: Secondary | ICD-10-CM

## 2020-11-03 DIAGNOSIS — K219 Gastro-esophageal reflux disease without esophagitis: Secondary | ICD-10-CM

## 2020-11-03 DIAGNOSIS — E119 Type 2 diabetes mellitus without complications: Secondary | ICD-10-CM

## 2020-11-03 DIAGNOSIS — Z1231 Encounter for screening mammogram for malignant neoplasm of breast: Secondary | ICD-10-CM

## 2020-11-03 DIAGNOSIS — Z1211 Encounter for screening for malignant neoplasm of colon: Secondary | ICD-10-CM

## 2020-11-03 DIAGNOSIS — I10 Essential (primary) hypertension: Secondary | ICD-10-CM

## 2020-11-03 DIAGNOSIS — F17200 Nicotine dependence, unspecified, uncomplicated: Secondary | ICD-10-CM

## 2020-11-03 DIAGNOSIS — M7501 Adhesive capsulitis of right shoulder: Secondary | ICD-10-CM

## 2020-11-03 LAB — COMPREHENSIVE METABOLIC PANEL
ALT: 13 U/L (ref 0–35)
AST: 12 U/L (ref 0–37)
Albumin: 3.9 g/dL (ref 3.5–5.2)
Alkaline Phosphatase: 119 U/L — ABNORMAL HIGH (ref 39–117)
BUN: 13 mg/dL (ref 6–23)
CO2: 30 mEq/L (ref 19–32)
Calcium: 10.9 mg/dL — ABNORMAL HIGH (ref 8.4–10.5)
Chloride: 104 mEq/L (ref 96–112)
Creatinine, Ser: 0.57 mg/dL (ref 0.40–1.20)
GFR: 106.96 mL/min (ref >60.00)
Glucose, Bld: 170 mg/dL — ABNORMAL HIGH (ref 70–99)
Potassium: 4.4 mEq/L (ref 3.5–5.1)
Sodium: 138 mEq/L (ref 135–145)
Total Bilirubin: 0.4 mg/dL (ref 0.2–1.2)
Total Protein: 6.8 g/dL (ref 6.0–8.3)

## 2020-11-03 LAB — LIPID PANEL
Cholesterol: 183 mg/dL (ref 0–200)
HDL: 42.6 mg/dL (ref >39.00)
LDL Cholesterol: 125 mg/dL — ABNORMAL HIGH (ref 0–99)
NonHDL: 140.63
Total CHOL/HDL Ratio: 4
Triglycerides: 80 mg/dL (ref 0.0–149.0)
VLDL: 16 mg/dL (ref 0.0–40.0)

## 2020-11-03 LAB — HEMOGLOBIN A1C: Hgb A1c MFr Bld: 7.2 % — ABNORMAL HIGH (ref 4.6–6.5)

## 2020-11-03 MED ORDER — CHLORTHALIDONE 25 MG PO TABS: 25.0000 mg | ORAL_TABLET | Freq: Every day | ORAL | 11 refills | Status: DC

## 2020-11-03 MED ORDER — ATORVASTATIN CALCIUM 10 MG PO TABS: 10.0000 mg | ORAL_TABLET | Freq: Every day | ORAL | 11 refills | Status: DC

## 2020-11-03 MED ORDER — METFORMIN HCL 500 MG PO TABS: 500.0000 mg | ORAL_TABLET | Freq: Two times a day (BID) | ORAL | 11 refills | Status: AC

## 2020-11-03 MED ORDER — OMEPRAZOLE 20 MG PO CPDR: 20.0000 mg | DELAYED_RELEASE_CAPSULE | Freq: Every day | ORAL | 11 refills | Status: AC

## 2020-11-03 MED ORDER — LOSARTAN POTASSIUM 50 MG PO TABS: 50.0000 mg | ORAL_TABLET | Freq: Every day | ORAL | 11 refills | Status: DC

## 2020-11-03 MED ORDER — BUPROPION HCL ER (XL) 150 MG PO TB24: ORAL_TABLET | ORAL | 5 refills | Status: AC

## 2020-11-03 MED ORDER — TRAZODONE HCL 50 MG PO TABS: 25.0000 mg | ORAL_TABLET | Freq: Every evening | ORAL | 11 refills | Status: AC | PRN

## 2020-11-03 NOTE — Progress Notes (Signed)
Subjective:    Patient ID: Kristine Bell, female    DOB: May 03, 1971, 50 y.o.   MRN: [id number]  HPI  Pt in for follow up. Seen last 12-03-2019.  Htn history with bp level of 155/82. She ran out of insurance months ago. Pt not on meds today. Pt was on losartan 50 mg daily. Also was on chorthalidone 25 mg daily.   Pt has diabetes. Last 1c was 6.7 She ran out of metformin as well.   For high cholesterol was on  Atorvastatin. Last check one year ago ldl was 115.  For smoking cessation rx wellbutrin. Rx advisement given.  Hx of gerd. She states over the counter meds not helping.Pt has been using famotadine 20 mg daily.   For insomnia rx trazadone.  Pt has hx of rt shoulder pain. Pain is chronic. Xray was negative per pt. She is now going to proceed with mri.   Pt is trying to get health insurance.    Review of Systems  Constitutional: Negative for chills, fatigue and fever.  Respiratory: Negative for chest tightness, shortness of breath and wheezing.   Cardiovascular: Negative for chest pain and palpitations.  Gastrointestinal: Positive for constipation. Negative for abdominal pain, diarrhea, nausea and vomiting.       1 bowel movement every 3 days.  Small hard stools.   Genitourinary: Negative for dysuria.  Musculoskeletal: Negative for back pain.  Skin: Negative for rash.  Neurological: Negative for dizziness, syncope, weakness, numbness and headaches.  Hematological: Negative for adenopathy. Does not bruise/bleed easily.  Psychiatric/Behavioral: Negative for behavioral problems and confusion.    Past Medical History:  Diagnosis Date  . Diabetes mellitus without complication ([place])   . GERD (gastroesophageal reflux disease)   . Hyperlipidemia   . Hypertension   . Kidney stone   . Kidney stones   . Obesity      Social History   Socioeconomic History  . Marital status: Single    Spouse name: Not on file  . Number of children: Not on file  . Years of education:  Not on file  . Highest education level: Not on file  Occupational History  . Not on file  Tobacco Use  . Smoking status: Current Every Day Smoker    Packs/day: 0.50    Types: Cigarettes  . Smokeless tobacco: Never Used  Substance and Sexual Activity  . Alcohol use: No  . Drug use: No  . Sexual activity: Not on file  Other Topics Concern  . Not on file  Social History Narrative  . Not on file   Social Determinants of Health   Financial Resource Strain: Not on file  Food Insecurity: Not on file  Transportation Needs: Not on file  Physical Activity: Not on file  Stress: Not on file  Social Connections: Not on file  Intimate Partner Violence: Not on file    Past Surgical History:  Procedure Laterality Date  . ABLATION ON ENDOMETRIOSIS    . HERNIA REPAIR    . LITHOTRIPSY    . TUBAL LIGATION      Family History  Problem Relation Age of Onset  . Hyperlipidemia Father   . Hypertension Father   . Hypertension Brother   . Cervical cancer Maternal Grandmother   . Crohn's disease Brother     Allergies  Allergen Reactions  . Naproxen Sodium Hives  . Naproxen   . Sertraline     Other reaction(s): EXACERBATE DEPRESSION  . Tramadol Nausea Only  Current Outpatient Medications on File Prior to Visit  Medication Sig Dispense Refill  . aspirin 81 MG chewable tablet Chew by mouth.    Marland Kitchen atorvastatin (LIPITOR) 10 MG tablet Take 1 tablet (10 mg total) by mouth daily. 30 tablet 3  . buPROPion (WELLBUTRIN XL) 150 MG 24 hr tablet 1 tab po q day 30 tablet 2  . chlorthalidone (HYGROTON) 25 MG tablet Take 1 tablet by mouth once daily 30 tablet 0  . cyclobenzaprine (FLEXERIL) 10 MG tablet Take 1 tablet (10 mg total) by mouth at bedtime. 7 tablet 0  . losartan (COZAAR) 50 MG tablet Take 1 tablet (50 mg total) by mouth daily. 30 tablet 3  . metFORMIN (GLUCOPHAGE) 500 MG tablet Take 1 tablet (500 mg total) by mouth 2 (two) times daily with a meal. 60 tablet 3  . traZODone (DESYREL) 50  MG tablet Take 0.5-1 tablets (25-50 mg total) by mouth at bedtime as needed for sleep. 30 tablet 3  . benzonatate (TESSALON) 100 MG capsule Take 1 capsule (100 mg total) by mouth every 8 (eight) hours. (Patient not taking: Reported on 11/03/2020) 21 capsule 0  . diclofenac sodium (VOLTAREN) 1 % GEL Apply 2 g topically 4 (four) times daily as needed. (Patient not taking: Reported on 11/03/2020) 100 g 0  . methylPREDNISolone (MEDROL) 4 MG tablet 5 tab po am and pm 4 tab po am and pm 3 tab po am and pm 2 tab po am and pm 1 tab po am and pm (Patient not taking: Reported on 11/03/2020) 30 tablet 0  . Multiple Vitamin (MULTI-VITAMIN) tablet Take 1 tablet by mouth daily.    . ondansetron (ZOFRAN ODT) 4 MG disintegrating tablet Take 1 tablet (4 mg total) by mouth every 8 (eight) hours as needed for nausea or vomiting. (Patient not taking: Reported on 11/03/2020) 16 tablet 0  . oxybutynin (DITROPAN) 5 MG tablet Take 5 mg by mouth 2 (two) times daily.    Marland Kitchen oxyCODONE-acetaminophen (PERCOCET/ROXICET) 5-325 MG tablet Take 1 tablet by mouth every 4 (four) hours as needed for severe pain. (Patient not taking: Reported on 11/03/2020) 15 tablet 0  . SUMAtriptan (IMITREX) 50 MG tablet Take 1 tablet (50 mg total) by mouth every 2 (two) hours as needed for migraine. May repeat in 2 hours if headache persists or recurs. (Patient not taking: Reported on 11/03/2020) 10 tablet 0   No current facility-administered medications on file prior to visit.    BP (!) 155/82   Pulse 69   Temp 98.1 F (36.7 C) (Oral)   Resp 20   Ht 5\' 5"  (1.651 m)   Wt (!) 346 lb (156.9 kg)   SpO2 98%   BMI 57.58 kg/m       Objective:   Physical Exam  General Mental Status- Alert. General Appearance- Not in acute distress.   Skin General: Color- Normal Color. Moisture- Normal Moisture.  Neck Carotid Arteries- Normal color. Moisture- Normal Moisture. No carotid bruits. No JVD.  Chest and Lung Exam Auscultation: Breath  Sounds:-Normal.  Cardiovascular Auscultation:Rythm- Regular. Murmurs & Other Heart Sounds:Auscultation of the heart reveals- No Murmurs.  Abdomen Inspection:-Inspeection Normal. Palpation/Percussion:Note:No mass. Palpation and Percussion of the abdomen reveal- Non Tender, Non Distended + BS, no rebound or guarding.   Neurologic Cranial Nerve exam:- CN III-XII intact(No nystagmus), symmetric smile. Strength:- 5/5 equal and symmetric strength both upper and lower extremities.      Assessment & Plan:  History of hypertension.  Your blood pressure is elevated  today but you have not been on your medications.  We will refill your losartan 50 mg daily and chlorthalidone 25 mg daily.  History of diabetes and last A1c about 1 year ago was 6.7.  Refilled your Metformin and will get metabolic panel with E0I today.  History of high cholesterol.  Refilling your atorvastatin today.  Getting fasting lipid panel.  History of smoking and some decrease over the last year.  He thought that Wellbutrin did help.  Refilling Wellbutrin today.  Recommend continuing to try to taper off completely.  For shoulder pain continue to follow-up with specialist.  For GERD prescribed omeprazole 20 mg daily.  For constipation recommend increase hydration, try to get regular exercise and could use Dulcolax over-the-counter.  If test not adequate then can use magnesium citrate.  Recommend not to overuse meds but use every third day as needed.  Follow-up date to be determined after lab review.

## 2020-11-03 NOTE — Telephone Encounter (Signed)
Future labs place.

## 2020-11-03 NOTE — Patient Instructions (Addendum)
History of hypertension.  Your blood pressure is elevated today but you have not been on your medications.  We will refill your losartan 50 mg daily and chlorthalidone 25 mg daily.  History of diabetes and last A1c about 1 year ago was 6.7.  Refilled your Metformin and will get metabolic panel with Y0D today.  History of high cholesterol.  Refilling your atorvastatin today.  Getting fasting lipid panel.  History of smoking and some decrease over the last year.  He thought that Wellbutrin did help.  Refilling Wellbutrin today.  Recommend continuing to try to taper off completely.  For shoulder pain continue to follow-up with specialist.  For GERD prescribed omeprazole 20 mg daily.  For constipation recommend increase hydration, try to get regular exercise and could use Dulcolax over-the-counter.  If test not adequate then can use magnesium citrate.  Recommend not to overuse meds but use every third day as needed.  Follow-up date to be determined after lab review.  Also placed referral to GI for screening colonoscopy and placed mammogram order.

## 2020-11-16 ENCOUNTER — Other Ambulatory Visit: Payer: Self-pay

## 2020-12-29 ENCOUNTER — Other Ambulatory Visit: Payer: Self-pay

## 2020-12-29 ENCOUNTER — Emergency Department (HOSPITAL_BASED_OUTPATIENT_CLINIC_OR_DEPARTMENT_OTHER)
Admission: EM | Admit: 2020-12-29 | Discharge: 2020-12-29 | Disposition: A | Discharge status: Home / Self Care | Payer: Self-pay | Attending: Emergency Medicine | Admitting: Emergency Medicine

## 2020-12-29 ENCOUNTER — Emergency Department (HOSPITAL_BASED_OUTPATIENT_CLINIC_OR_DEPARTMENT_OTHER): Payer: Self-pay

## 2020-12-29 ENCOUNTER — Encounter (HOSPITAL_BASED_OUTPATIENT_CLINIC_OR_DEPARTMENT_OTHER): Payer: Self-pay | Admitting: *Deleted

## 2020-12-29 ENCOUNTER — Telehealth: Payer: Self-pay | Admitting: Medical

## 2020-12-29 DIAGNOSIS — M545 Low back pain, unspecified: Secondary | ICD-10-CM | POA: Insufficient documentation

## 2020-12-29 DIAGNOSIS — Z79899 Other long term (current) drug therapy: Secondary | ICD-10-CM | POA: Insufficient documentation

## 2020-12-29 DIAGNOSIS — Z7984 Long term (current) use of oral hypoglycemic drugs: Secondary | ICD-10-CM | POA: Insufficient documentation

## 2020-12-29 DIAGNOSIS — R42 Dizziness and giddiness: Secondary | ICD-10-CM | POA: Insufficient documentation

## 2020-12-29 DIAGNOSIS — F17210 Nicotine dependence, cigarettes, uncomplicated: Secondary | ICD-10-CM | POA: Insufficient documentation

## 2020-12-29 DIAGNOSIS — E119 Type 2 diabetes mellitus without complications: Secondary | ICD-10-CM | POA: Insufficient documentation

## 2020-12-29 DIAGNOSIS — R350 Frequency of micturition: Secondary | ICD-10-CM

## 2020-12-29 DIAGNOSIS — Z7982 Long term (current) use of aspirin: Secondary | ICD-10-CM | POA: Insufficient documentation

## 2020-12-29 DIAGNOSIS — I10 Essential (primary) hypertension: Secondary | ICD-10-CM | POA: Insufficient documentation

## 2020-12-29 LAB — CBC WITH DIFFERENTIAL/PLATELET
Abs Immature Granulocytes: 0.03 10*3/uL (ref 0.00–0.07)
Basophils Absolute: 0.1 10*3/uL (ref 0.0–0.1)
Basophils Relative: 1 %
Eosinophils Absolute: 0.1 10*3/uL (ref 0.0–0.5)
Eosinophils Relative: 1 %
HCT: 41.3 % (ref 36.0–46.0)
Hemoglobin: 13.1 g/dL (ref 12.0–15.0)
Immature Granulocytes: 0 %
Lymphocytes Relative: 28 %
Lymphs Abs: 2.7 10*3/uL (ref 0.7–4.0)
MCH: 26.4 pg (ref 26.0–34.0)
MCHC: 31.7 g/dL (ref 30.0–36.0)
MCV: 83.1 fL (ref 80.0–100.0)
Monocytes Absolute: 0.5 10*3/uL (ref 0.1–1.0)
Monocytes Relative: 5 %
Neutro Abs: 6.2 10*3/uL (ref 1.7–7.7)
Neutrophils Relative %: 65 %
Platelets: 291 10*3/uL (ref 150–400)
RBC: 4.97 MIL/uL (ref 3.87–5.11)
RDW: 15.6 % — ABNORMAL HIGH (ref 11.5–15.5)
WBC: 9.7 10*3/uL (ref 4.0–10.5)
nRBC: 0 % (ref 0.0–0.2)

## 2020-12-29 LAB — BASIC METABOLIC PANEL
Anion gap: 8 (ref 5–15)
BUN: 12 mg/dL (ref 6–20)
CO2: 26 mmol/L (ref 22–32)
Calcium: 10.3 mg/dL (ref 8.9–10.3)
Chloride: 101 mmol/L (ref 98–111)
Creatinine, Ser: 0.57 mg/dL (ref 0.44–1.00)
GFR, Estimated: >60 mL/min (ref >60)
Glucose, Bld: 122 mg/dL — ABNORMAL HIGH (ref 70–99)
Potassium: 3.4 mmol/L — ABNORMAL LOW (ref 3.5–5.1)
Sodium: 135 mmol/L (ref 135–145)

## 2020-12-29 LAB — URINALYSIS, MICROSCOPIC (REFLEX)

## 2020-12-29 LAB — URINALYSIS, ROUTINE W REFLEX MICROSCOPIC
Bilirubin Urine: NEGATIVE
Glucose, UA: NEGATIVE mg/dL
Ketones, ur: NEGATIVE mg/dL
Leukocytes,Ua: NEGATIVE
Nitrite: NEGATIVE
Protein, ur: NEGATIVE mg/dL
Specific Gravity, Urine: 1.02 (ref 1.005–1.030)
pH: 6 (ref 5.0–8.0)

## 2020-12-29 LAB — CBG MONITORING, ED: Glucose-Capillary: 99 mg/dL (ref 70–99)

## 2020-12-29 MED ORDER — MORPHINE SULFATE (PF) 4 MG/ML IV SOLN
4.0000 mg | Freq: Once | INTRAVENOUS | Status: AC
  Administered 2020-12-29: 4 mg via INTRAVENOUS
  Filled 2020-12-29: qty 1

## 2020-12-29 MED ORDER — METHOCARBAMOL 500 MG PO TABS: 500.0000 mg | ORAL_TABLET | Freq: Two times a day (BID) | ORAL | 0 refills | Status: DC | PRN

## 2020-12-29 MED ORDER — ONDANSETRON HCL 4 MG/2ML IJ SOLN
4.0000 mg | Freq: Once | INTRAMUSCULAR | Status: AC
  Administered 2020-12-29: 4 mg via INTRAVENOUS
  Filled 2020-12-29: qty 2

## 2020-12-29 NOTE — ED Triage Notes (Signed)
3 days of back pain, headache and dizziness. She feels like she has a kidney stone. She feels her BP is elevation.

## 2020-12-29 NOTE — ED Notes (Signed)
Pt states that she is ready to go, pt made aware that she is not up for discharge at this time but staff will be in to discharge as soon as provider has paperwork ready.

## 2020-12-29 NOTE — Telephone Encounter (Signed)
Patient called back , patient advised to follow up with ED or UC today. Patient understand and agreed

## 2020-12-29 NOTE — ED Notes (Signed)
Pt did not want vitals updated and she was explain why they needed to be updated. Pt and family member stated that they wanted their discharge paperwork and ready to go home. RN Deatra Canter was informed.

## 2020-12-29 NOTE — Telephone Encounter (Signed)
Pt may need to be seen in UC or ED today. She has been on bp med for a while. Would not assume med side effects. She would need labs, bp check maybe ct scan head?   Just can't make med change without evlaluation and looks like full today. Please advise UC or ED.  If she declines uc or ED today please let me know.

## 2020-12-29 NOTE — ED Provider Notes (Signed)
[facility] Provider Note   CSN: [id number] Arrival date & time: 12/29/20  1209     History Chief Complaint  Patient presents with  . Dizziness  . Back Pain  . Headache    Kristine Bell is a 50 y.o. female who presents with c/o back pain. BL, worse on the left. Onset 5 days ago. Pain is worse with movement. Better with rest. Denies weakness, loss of bowel/bladder function or saddle anesthesia. Denies neck stiffness, headache, rash.  Denies fever or recent procedures to back. Hx of kidney stone and worried this is the same. She has frequncy and urgency, but feel like the urine volume is less than normal.  Patient is on a new blood pressure medication.  She has been feeling a little bit more dizzy with standing and has had mild headache, Denies photophobia, phonophobia, UL throbbing, N/V, visual changes, stiff neck, neck pain, rash, or "thunderclap" onset.  Denies fevers, suprapubic pain or vomiting   The history is provided by the patient.       Past Medical History:  Diagnosis Date  . Diabetes mellitus without complication ([place])   . GERD (gastroesophageal reflux disease)   . Hyperlipidemia   . Hypertension   . Kidney stone   . Kidney stones   . Obesity     Patient Active Problem List   Diagnosis Date Noted  . Scapular dysfunction 06/01/2020  . Capsulitis of right shoulder 06/01/2020  . 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 Hydronephrosis of left kidney 08/11/2015  . Ureteral calculus, left 08/11/2015  . Panic attack 03/09/2015  . Pelvic pain in female 03/09/2015  . Polyuria 03/09/2015  . 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 Closed fracture of lateral malleolus 01/20/2015  . Sprain of left ankle 01/20/2015  . Chronic pain syndrome 12/02/2014  . Atypical chest pain 11/11/2014  . Hypercalcemia 11/11/2014  . Bilateral carpal tunnel syndrome 10/23/2014  . Encounter for monitoring opioid maintenance therapy 10/02/2014  . Chronic  constipation 07/15/2014  . Diabetes mellitus type 2, controlled, without complications ([place]) 10/93/2355  . Hypokalemia 07/15/2014  . Polycystic ovarian syndrome 07/15/2014  . Morbid obesity ([place]) 05/15/2014  . Ovarian cyst 03/17/2014  . Allergic rhinitis 12/31/2013  . Hyposmolality and/or hyponatremia 12/31/2013  . Iron deficiency anemia 12/31/2013  . Leiomyoma of uterus 12/31/2013  . Tobacco use disorder 12/31/2013  . Urinary incontinence 12/31/2013  . Excessive and frequent menstruation 09/18/2013  . Malaise and fatigue 09/18/2013  . Metrorrhagia 09/18/2013  . Symptom associated with female genital organs 09/18/2013  . Exostosis 08/26/2013  . Essential hypertension 05/27/2013  . Pain in joint involving lower leg 05/27/2013  . Osteoarthritis of knee 05/27/2013  . Cholelithiasis 03/11/2013  . Edema 03/11/2013  . Esophageal reflux 03/11/2013  . Neck pain 03/11/2013  . Backache 03/11/2013  . Depressive disorder 02/16/2013  . Dizziness 02/16/2013  . Dysphagia 02/16/2013  . Hypercholesterolemia 02/16/2013  . Migraine 02/16/2013  . Pain in thoracic spine 02/16/2013  . Primary localized osteoarthrosis, lower leg 02/16/2013  . Spasm of muscle 02/16/2013  . Ventral hernia 02/16/2013    Past Surgical History:  Procedure Laterality Date  . ABLATION ON ENDOMETRIOSIS    . HERNIA REPAIR    . LITHOTRIPSY    . TUBAL LIGATION       OB History   No obstetric history on file.     Family History  Problem Relation Age of Onset  . Hyperlipidemia Father   . Hypertension Father   .  Hypertension Brother   . Cervical cancer Maternal Grandmother   . Crohn's disease Brother     Social History   Tobacco Use  . Smoking status: Current Every Day Smoker    Packs/day: 0.50    Types: Cigarettes  . Smokeless tobacco: Never Used  Substance Use Topics  . Alcohol use: No  . Drug use: No    Home Medications Prior to Admission medications   Medication Sig Start Date End Date Taking?  Authorizing Provider  aspirin 81 MG chewable tablet Chew by mouth.   Yes [provider]  atorvastatin (LIPITOR) 10 MG tablet Take 1 tablet (10 mg total) by mouth daily. 11/03/20  Yes Saguier, Percell Miller, PA-C  chlorthalidone (HYGROTON) 25 MG tablet Take 1 tablet (25 mg total) by mouth daily. 11/03/20  Yes Saguier, Percell Miller, PA-C  losartan (COZAAR) 50 MG tablet Take 1 tablet (50 mg total) by mouth daily. 11/03/20  Yes Saguier, Percell Miller, PA-C  metFORMIN (GLUCOPHAGE) 500 MG tablet Take 1 tablet (500 mg total) by mouth 2 (two) times daily with a meal. 11/03/20  Yes Saguier, Percell Miller, PA-C  benzonatate (TESSALON) 100 MG capsule Take 1 capsule (100 mg total) by mouth every 8 (eight) hours. Patient not taking: No sig reported 12/18/19   Tedd Sias, PA  buPROPion (WELLBUTRIN XL) 150 MG 24 hr tablet 1 tab po q day 11/03/20   Saguier, Percell Miller, PA-C  cyclobenzaprine (FLEXERIL) 10 MG tablet Take 1 tablet (10 mg total) by mouth at bedtime. 11/26/19   Saguier, Percell Miller, PA-C  diclofenac sodium (VOLTAREN) 1 % GEL Apply 2 g topically 4 (four) times daily as needed. Patient not taking: No sig reported 07/15/19   Long, Wonda Olds, MD  methylPREDNISolone (MEDROL) 4 MG tablet 5 tab po am and pm 4 tab po am and pm 3 tab po am and pm 2 tab po am and pm 1 tab po am and pm Patient not taking: No sig reported 12/03/19   Saguier, Percell Miller, PA-C  Multiple Vitamin (MULTI-VITAMIN) tablet Take 1 tablet by mouth daily.    [provider]  omeprazole (PRILOSEC) 20 MG capsule Take 1 capsule (20 mg total) by mouth daily. 11/03/20   Saguier, Percell Miller, PA-C  ondansetron (ZOFRAN ODT) 4 MG disintegrating tablet Take 1 tablet (4 mg total) by mouth every 8 (eight) hours as needed for nausea or vomiting. Patient not taking: No sig reported 08/24/20   Sherol Dade E, PA-C  oxybutynin (DITROPAN) 5 MG tablet Take 5 mg by mouth 2 (two) times daily. 09/01/20   [provider]  oxyCODONE-acetaminophen (PERCOCET/ROXICET) 5-325  MG tablet Take 1 tablet by mouth every 4 (four) hours as needed for severe pain. Patient not taking: No sig reported 08/25/20   Barrie Folk, PA-C  SUMAtriptan (IMITREX) 50 MG tablet Take 1 tablet (50 mg total) by mouth every 2 (two) hours as needed for migraine. May repeat in 2 hours if headache persists or recurs. Patient not taking: No sig reported 11/27/19   Saguier, Percell Miller, PA-C  traZODone (DESYREL) 50 MG tablet Take 0.5-1 tablets (25-50 mg total) by mouth at bedtime as needed for sleep. 11/03/20   Saguier, Percell Miller, PA-C    Allergies    Naproxen sodium, Naproxen, Sertraline, and Tramadol  Review of Systems   Review of Systems Ten systems reviewed and are negative for acute change, except as noted in the HPI.   Physical Exam Updated Vital Signs BP (!) 174/83   Pulse (!) 51   Temp 98 F (36.7  C) (Oral)   Resp 18   Ht 5\' 5"  (1.651 m)   Wt (!) 154.8 kg   SpO2 100%   BMI 56.80 kg/m   Physical Exam Vitals and nursing note reviewed.  Constitutional:      General: She is not in acute distress.    Appearance: She is well-developed. She is not diaphoretic.  HENT:     Head: Normocephalic and atraumatic.  Eyes:     General: No scleral icterus.    Conjunctiva/sclera: Conjunctivae normal.  Cardiovascular:     Rate and Rhythm: Normal rate and regular rhythm.     Heart sounds: Normal heart sounds. No murmur heard. No friction rub. No gallop.   Pulmonary:     Effort: Pulmonary effort is normal. No respiratory distress.     Breath sounds: Normal breath sounds.  Abdominal:     General: Bowel sounds are normal. There is no distension.     Palpations: Abdomen is soft. There is no mass.     Tenderness: There is no abdominal tenderness. There is no right CVA tenderness, left CVA tenderness or guarding.  Musculoskeletal:     Cervical back: Normal range of motion.     Lumbar back: Spasms and tenderness present. No bony tenderness. Decreased range of motion.        Back:  Skin:    General: Skin is warm and dry.  Neurological:     Mental Status: She is alert and oriented to person, place, and time.  Psychiatric:        Behavior: Behavior normal.     ED Results / Procedures / Treatments   Labs (all labs ordered are listed, but only abnormal results are displayed) Labs Reviewed  URINALYSIS, ROUTINE W REFLEX MICROSCOPIC - Abnormal; Notable for the following components:      Result Value   Hgb urine dipstick SMALL (*)    All other components within normal limits  BASIC METABOLIC PANEL - Abnormal; Notable for the following components:   Potassium 3.4 (*)    Glucose, Bld 122 (*)    All other components within normal limits  CBC WITH DIFFERENTIAL/PLATELET - Abnormal; Notable for the following components:   RDW 15.6 (*)    All other components within normal limits  URINALYSIS, MICROSCOPIC (REFLEX) - Abnormal; Notable for the following components:   Bacteria, UA RARE (*)    All other components within normal limits  CBG MONITORING, ED    EKG None  Radiology CT Renal Stone Study  Result Date: 12/29/2020 CLINICAL DATA:  Three days of left flank pain with headaches and dizziness. Kidney stone suspected. EXAM: CT ABDOMEN AND PELVIS WITHOUT CONTRAST TECHNIQUE: Multidetector CT imaging of the abdomen and pelvis was performed following the standard protocol without IV contrast. COMPARISON:  Abdominopelvic CT 08/24/2020. FINDINGS: Lower chest: Clear lung bases. No significant pleural or pericardial effusion. Hepatobiliary: The liver appears unremarkable as imaged in the noncontrast state. No evidence of gallstones, gallbladder wall thickening or biliary dilatation. Pancreas: Unremarkable. No pancreatic ductal dilatation or surrounding inflammatory changes. Spleen: Normal in size without focal abnormality. Adrenals/Urinary Tract: Both adrenal glands appear normal. Probable tiny bilateral renal calculi, involving the posterior interpolar region of the right  kidney and the lower pole of the left kidney, best seen on the reformatted images. No evidence hydronephrosis, perinephric soft tissue stranding or definite ureteral calculus. Innumerable pelvic calcifications bilaterally are similar to the previous study and likely all phleboliths. No bladder abnormalities are seen. Stomach/Bowel: No enteric contrast administered.  The stomach appears unremarkable for its degree of distension. No evidence of bowel wall thickening, distention or surrounding inflammatory change. The appendix appears normal. Mild descending and sigmoid colon diverticulosis. Vascular/Lymphatic: There are no enlarged abdominal or pelvic lymph nodes. Scattered small retroperitoneal and inguinal lymph nodes bilaterally are stable. Minimal aortic atherosclerosis without acute vascular findings on noncontrast imaging. Reproductive: Stable lobulated mild enlargement of the uterus, likely due to fibroids. No adnexal mass. Other: Stable postsurgical changes in the anterior abdominal wall with a small residual periumbilical hernia containing fat. No ascites or free air. Musculoskeletal: No acute or significant osseous findings. Prominent facet hypertrophy in the lower lumbar spine. IMPRESSION: 1. Probable small nonobstructing bilateral renal calculi. No evidence of ureteral calculus, hydronephrosis or other secondary signs of ureteral obstruction. 2. No acute abdominopelvic findings. 3. Stable incidental findings including probable uterine fibroids, mild distal colonic diverticulosis and lumbar spondylosis. Electronically Signed   By: Richardean Sale M.D.   On: 12/29/2020 14:29    Procedures Procedures   Medications Ordered in ED Medications  morphine 4 MG/ML injection 4 mg (4 mg Intravenous Given 12/29/20 1407)  ondansetron (ZOFRAN) injection 4 mg (4 mg Intravenous Given 12/29/20 1408)    ED Course  I have reviewed the triage vital signs and the nursing notes.  Pertinent labs & imaging results that  were available during my care of the patient were reviewed by me and considered in my medical decision making (see chart for details).    MDM Rules/Calculators/A&P                          50 year old female here with lower back pain.  She has no red flag symptoms.  Concern for potential kidney stone as she has a previous history of lithotripsy due to large stone and renal colic.  I ordered and reviewed labs that show no acute abnormalities, CBC, BMP and CBG within normal limits.  Urine shows rare bacteria small amount of hemoglobin and a few calcium oxalate crystals.  I ordered and reviewed a CT renal stone study that shows nonobstructing bilateral kidney stones.  Patient given pain medication with improvement in her symptoms.  This is likely musculoskeletal.  Will discharge with Robaxin.  She may follow-up with her PCP.  Appears otherwise appropriate for discharge at this time.  Final Clinical Impression(s) / ED Diagnoses Final diagnoses:  None    Rx / DC Orders ED Discharge Orders    None       Margarita Mail, PA-C 12/29/20 1819    Little, Wenda Overland, MD 12/30/20 [phone]

## 2020-12-29 NOTE — Discharge Instructions (Signed)
SEEK IMMEDIATE MEDICAL ATTENTION IF: New numbness, tingling, weakness, or problem with the use of your arms or legs.  Severe back pain not relieved with medications.  Change in bowel or bladder control.  Increasing pain in any areas of the body (such as chest or abdominal pain).  Shortness of breath, dizziness or fainting.  Nausea (feeling sick to your stomach), vomiting, fever, or sweats.  

## 2020-12-29 NOTE — Telephone Encounter (Signed)
Called pt and lvm to return call.  

## 2020-12-29 NOTE — Telephone Encounter (Signed)
[name]  Call Back @ [phone]  Patient states her blood pressure medication is making her dizzy and she is  experiencing  headaches  Please advise if bp medication can be changed to another medication

## 2021-01-21 ENCOUNTER — Ambulatory Visit: Payer: Medicaid Other | Admitting: Family Medicine

## 2021-01-21 NOTE — Progress Notes (Deleted)
  Kendalynn Deweese - 50 y.o. female MRN [id number]  Date of birth: 07/27/1971  SUBJECTIVE:  Including CC & ROS.  No chief complaint on file.   Virgen Grothaus is a 50 y.o. female that is  ***.  ***   Review of Systems See HPI   HISTORY: Past Medical, Surgical, Social, and Family History Reviewed & Updated per EMR.   Pertinent Historical Findings include:  Past Medical History:  Diagnosis Date  . Diabetes mellitus without complication (HCC)   . GERD (gastroesophageal reflux disease)   . Hyperlipidemia   . Hypertension   . Kidney stone   . Kidney stones   . Obesity     Past Surgical History:  Procedure Laterality Date  . ABLATION ON ENDOMETRIOSIS    . HERNIA REPAIR    . LITHOTRIPSY    . TUBAL LIGATION      Family History  Problem Relation Age of Onset  . Hyperlipidemia Father   . Hypertension Father   . Hypertension Brother   . Cervical cancer Maternal Grandmother   . Crohn's disease Brother     Social History   Socioeconomic History  . Marital status: Single    Spouse name: Not on file  . Number of children: Not on file  . Years of education: Not on file  . Highest education level: Not on file  Occupational History  . Not on file  Tobacco Use  . Smoking status: Current Every Day Smoker    Packs/day: 0.50    Types: Cigarettes  . Smokeless tobacco: Never Used  Substance and Sexual Activity  . Alcohol use: No  . Drug use: No  . Sexual activity: Not on file  Other Topics Concern  . Not on file  Social History Narrative  . Not on file   Social Determinants of Health   Financial Resource Strain: Not on file  Food Insecurity: Not on file  Transportation Needs: Not on file  Physical Activity: Not on file  Stress: Not on file  Social Connections: Not on file  Intimate Partner Violence: Not on file     PHYSICAL EXAM:  VS: There were no vitals taken for this visit. Physical Exam Gen: NAD, alert, cooperative with exam, well-appearing MSK:  ***       ASSESSMENT & PLAN:   No problem-specific Assessment & Plan notes found for this encounter.     

## 2021-01-26 ENCOUNTER — Ambulatory Visit: Payer: Medicaid Other | Admitting: Family Medicine

## 2021-01-26 NOTE — Progress Notes (Deleted)
  Kristine Bell - 50 y.o. female MRN [id number]  Date of birth: [birth year]  SUBJECTIVE:  Including CC & ROS.  No chief complaint on file.   Kristine Bell is a 50 y.o. female that is  ***.  ***   Review of Systems See HPI   HISTORY: Past Medical, Surgical, Social, and Family History Reviewed & Updated per EMR.   Pertinent Historical Findings include:  Past Medical History:  Diagnosis Date  . Diabetes mellitus without complication ([place])   . GERD (gastroesophageal reflux disease)   . Hyperlipidemia   . Hypertension   . Kidney stone   . Kidney stones   . Obesity     Past Surgical History:  Procedure Laterality Date  . ABLATION ON ENDOMETRIOSIS    . HERNIA REPAIR    . LITHOTRIPSY    . TUBAL LIGATION      Family History  Problem Relation Age of Onset  . Hyperlipidemia Father   . Hypertension Father   . Hypertension Brother   . Cervical cancer Maternal Grandmother   . Crohn's disease Brother     Social History   Socioeconomic History  . Marital status: Single    Spouse name: Not on file  . Number of children: Not on file  . Years of education: Not on file  . Highest education level: Not on file  Occupational History  . Not on file  Tobacco Use  . Smoking status: Current Every Day Smoker    Packs/day: 0.50    Types: Cigarettes  . Smokeless tobacco: Never Used  Substance and Sexual Activity  . Alcohol use: No  . Drug use: No  . Sexual activity: Not on file  Other Topics Concern  . Not on file  Social History Narrative  . Not on file   Social Determinants of Health   Financial Resource Strain: Not on file  Food Insecurity: Not on file  Transportation Needs: Not on file  Physical Activity: Not on file  Stress: Not on file  Social Connections: Not on file  Intimate Partner Violence: Not on file     PHYSICAL EXAM:  VS: There were no vitals taken for this visit. Physical Exam Gen: NAD, alert, cooperative with exam, well-appearing MSK:  ***       ASSESSMENT & PLAN:   No problem-specific Assessment & Plan notes found for this encounter.

## 2021-03-11 ENCOUNTER — Other Ambulatory Visit: Payer: Self-pay

## 2021-03-11 ENCOUNTER — Encounter: Payer: Self-pay | Admitting: Family Medicine

## 2021-03-11 ENCOUNTER — Ambulatory Visit: Payer: Self-pay

## 2021-03-11 ENCOUNTER — Ambulatory Visit (INDEPENDENT_AMBULATORY_CARE_PROVIDER_SITE_OTHER): Payer: Self-pay | Admitting: Family Medicine

## 2021-03-11 VITALS — BP 160/92 | Ht 65.0 in | Wt 341.0 lb

## 2021-03-11 DIAGNOSIS — M778 Other enthesopathies, not elsewhere classified: Secondary | ICD-10-CM

## 2021-03-11 DIAGNOSIS — M5412 Radiculopathy, cervical region: Secondary | ICD-10-CM

## 2021-03-11 MED ORDER — TRIAMCINOLONE ACETONIDE 40 MG/ML IJ SUSP
40.0000 mg | Freq: Once | INTRAMUSCULAR | Status: AC
  Administered 2021-03-11: 40 mg via INTRA_ARTICULAR

## 2021-03-11 NOTE — Assessment & Plan Note (Signed)
Seems to have a component of radiculopathy as she has sensations that go down to her hands and fingers. -Counseled on home exercise therapy and supportive care. -Has tried gabapentin in the past which did not improve her symptoms. -Could consider MRI for consideration of epidural.

## 2021-03-11 NOTE — Patient Instructions (Signed)
Good to see you Please try heat on the neck  Please try ice on the shoulder  Please try the exercises  Please use the duexis as needed  Please send me a message in MyChart with any questions or updates.  Please see me back as needed.   --Dr. Raeford Razor

## 2021-03-11 NOTE — Progress Notes (Signed)
Medication Samples have been provided to the patient.  Drug name: Duexis       Strength: 800/26.6 mg        Qty: 2 boxes  LOT: [id number]  Exp.Date: 04/2022  Dosing instructions: Take 1 tablet three times daily  The patient has been instructed regarding the correct time, dose, and frequency of taking this medication, including desired effects and most common side effects.   Jonathon Resides 10:23 AM 03/11/2021

## 2021-03-11 NOTE — Assessment & Plan Note (Signed)
Acutely worsening of shoulder.  Has been unable to schedule the MRI. -Counseled on home exercise therapy and supportive care -Injection today. -Awaiting to get MRI completed.

## 2021-03-11 NOTE — Progress Notes (Signed)
Kristine Bell - 50 y.o. female MRN [id number]  Date of birth: 02/04/1971  SUBJECTIVE:  Including CC & ROS.  No chief complaint on file.   Kristine Bell is a 50 y.o. female that is presenting with acute worsening of her right shoulder pain.  Does get improvement with the injections.  Also experiencing pain from her neck down to her hand.   Review of Systems See HPI   HISTORY: Past Medical, Surgical, Social, and Family History Reviewed & Updated per EMR.   Pertinent Historical Findings include:  Past Medical History:  Diagnosis Date   Diabetes mellitus without complication (HCC)    GERD (gastroesophageal reflux disease)    Hyperlipidemia    Hypertension    Kidney stone    Kidney stones    Obesity     Past Surgical History:  Procedure Laterality Date   ABLATION ON ENDOMETRIOSIS     HERNIA REPAIR     LITHOTRIPSY     TUBAL LIGATION      Family History  Problem Relation Age of Onset   Hyperlipidemia Father    Hypertension Father    Hypertension Brother    Cervical cancer Maternal Grandmother    Crohn's disease Brother     Social History   Socioeconomic History   Marital status: Single    Spouse name: Not on file   Number of children: Not on file   Years of education: Not on file   Highest education level: Not on file  Occupational History   Not on file  Tobacco Use   Smoking status: Every Day    Packs/day: 0.50    Pack years: 0.00    Types: Cigarettes   Smokeless tobacco: Never  Substance and Sexual Activity   Alcohol use: No   Drug use: No   Sexual activity: Not on file  Other Topics Concern   Not on file  Social History Narrative   Not on file   Social Determinants of Health   Financial Resource Strain: Not on file  Food Insecurity: Not on file  Transportation Needs: Not on file  Physical Activity: Not on file  Stress: Not on file  Social Connections: Not on file  Intimate Partner Violence: Not on file     PHYSICAL EXAM:  VS: BP (!) 160/92 (BP  Location: Left Arm, Patient Position: Sitting, Cuff Size: Large)   Ht 5\' 5"  (1.651 m)   Wt (!) 341 lb (154.7 kg)   BMI 56.75 kg/m  Physical Exam Gen: NAD, alert, cooperative with exam, well-appearing    Aspiration/Injection Procedure Note Kristine Bell November 17, 1970  Procedure: Injection Indications: Right shoulder pain  Procedure Details Consent: Risks of procedure as well as the alternatives and risks of each were explained to the (patient/caregiver).  Consent for procedure obtained. Time Out: Verified patient identification, verified procedure, site/side was marked, verified correct patient position, special equipment/implants available, medications/allergies/relevent history reviewed, required imaging and test results available.  Performed.  The area was cleaned with iodine and alcohol swabs.    The right glenohumeral joint was injected using 3 cc of 1% lidocaine on a 22-gauge 3-1/2 inch needle.  The syringe was switched and a mixture containing 1 cc's of 40 mg Kenalog and 4 cc's of 0.25% bupivacaine was injected.  Ultrasound was used. Images were obtained in short views showing the injection.     A sterile dressing was applied.  Patient did tolerate procedure well.      ASSESSMENT & PLAN:   Cervical radiculopathy Seems  to have a component of radiculopathy as she has sensations that go down to her hands and fingers. -Counseled on home exercise therapy and supportive care. -Has tried gabapentin in the past which did not improve her symptoms. -Could consider MRI for consideration of epidural.  Capsulitis of right shoulder Acutely worsening of shoulder.  Has been unable to schedule the MRI. -Counseled on home exercise therapy and supportive care -Injection today. -Awaiting to get MRI completed.

## 2021-04-06 ENCOUNTER — Telehealth: Payer: Self-pay | Admitting: Family Medicine

## 2021-04-06 NOTE — Telephone Encounter (Signed)
---  Forwarding request to provider & or med assistant.  ----Patient called to ask for samples of the duexis   Please contact pt w/ decision @ [phone]  --glh

## 2021-04-08 NOTE — Telephone Encounter (Signed)
Samples are upfront for p/u. Left detailed message informing pt.   Medication Samples have been provided to the patient.  Drug name: Duexis       Strength: 800/26.6 mg        Qty: 2 boxes  LOT: [id number]  Exp.Date: 04/2022  Dosing instructions: take 1 three times daily  The patient has been instructed regarding the correct time, dose, and frequency of taking this medication, including desired effects and most common side effects.   Kristine Bell 1:23 PM 04/08/2021

## 2021-06-01 ENCOUNTER — Telehealth: Payer: Self-pay | Admitting: Family Medicine

## 2021-06-01 NOTE — Telephone Encounter (Signed)
Pt called says shoulder pain is back & she request any samples of :  diclofenac sodium (VOLTAREN) 1 % GEL [[id number]]    Order Details Dose: 2 g Route: Topical Frequency: 4 times daily PRN  Dispense Quantity: 100 g Refills: 0        Sig: Apply 2 g topically 4 (four) times daily as needed.         --forwarding request to med asst for review w/ provider .  --glh

## 2021-07-09 ENCOUNTER — Ambulatory Visit (INDEPENDENT_AMBULATORY_CARE_PROVIDER_SITE_OTHER): Payer: Self-pay | Admitting: Family Medicine

## 2021-07-09 ENCOUNTER — Ambulatory Visit: Payer: Self-pay

## 2021-07-09 ENCOUNTER — Encounter: Payer: Self-pay | Admitting: Family Medicine

## 2021-07-09 VITALS — BP 162/98 | Ht 65.0 in | Wt 341.0 lb

## 2021-07-09 DIAGNOSIS — M778 Other enthesopathies, not elsewhere classified: Secondary | ICD-10-CM

## 2021-07-09 MED ORDER — TRIAMCINOLONE ACETONIDE 40 MG/ML IJ SUSP
40.0000 mg | Freq: Once | INTRAMUSCULAR | Status: AC
  Administered 2021-07-09: 40 mg via INTRA_ARTICULAR

## 2021-07-09 NOTE — Progress Notes (Signed)
Kristine Bell - 50 y.o. female MRN [id number]  Date of birth: January 05, 1971  SUBJECTIVE:  Including CC & ROS.  No chief complaint on file.   Kristine Bell is a 50 y.o. female that is  presenting with acute on chronic right shoulder pain. No injury. Pain feels similar to previously.   Review of Systems See HPI   HISTORY: Past Medical, Surgical, Social, and Family History Reviewed & Updated per EMR.   Pertinent Historical Findings include:  Past Medical History:  Diagnosis Date   Diabetes mellitus without complication (HCC)    GERD (gastroesophageal reflux disease)    Hyperlipidemia    Hypertension    Kidney stone    Kidney stones    Obesity     Past Surgical History:  Procedure Laterality Date   ABLATION ON ENDOMETRIOSIS     HERNIA REPAIR     LITHOTRIPSY     TUBAL LIGATION      Family History  Problem Relation Age of Onset   Hyperlipidemia Father    Hypertension Father    Hypertension Brother    Cervical cancer Maternal Grandmother    Crohn's disease Brother     Social History   Socioeconomic History   Marital status: Single    Spouse name: Not on file   Number of children: Not on file   Years of education: Not on file   Highest education level: Not on file  Occupational History   Not on file  Tobacco Use   Smoking status: Every Day    Packs/day: 0.50    Types: Cigarettes   Smokeless tobacco: Never  Substance and Sexual Activity   Alcohol use: No   Drug use: No   Sexual activity: Not on file  Other Topics Concern   Not on file  Social History Narrative   Not on file   Social Determinants of Health   Financial Resource Strain: Not on file  Food Insecurity: Not on file  Transportation Needs: Not on file  Physical Activity: Not on file  Stress: Not on file  Social Connections: Not on file  Intimate Partner Violence: Not on file     PHYSICAL EXAM:  VS: BP (!) 162/98 (BP Location: Left Arm, Patient Position: Sitting)   Ht 5\' 5"  (1.651 m)   Wt (!)  341 lb (154.7 kg)   BMI 56.75 kg/m  Physical Exam Gen: NAD, alert, cooperative with exam, well-appearing    Aspiration/Injection Procedure Note Kristine Bell 1971-07-06  Procedure: Injection Indications: right shoulder pain   Procedure Details Consent: Risks of procedure as well as the alternatives and risks of each were explained to the (patient/caregiver).  Consent for procedure obtained. Time Out: Verified patient identification, verified procedure, site/side was marked, verified correct patient position, special equipment/implants available, medications/allergies/relevent history reviewed, required imaging and test results available.  Performed.  The area was cleaned with iodine and alcohol swabs.    The right glenohumeral joint was injected using 3 cc's of 1% lidocaine on a 22 gauge 3 1/2" needle. The syringe was switched and 1 cc's of 40 mg kenalog and 4 cc's of 0.25% bupivacaine was injected.  Ultrasound was used. Images were obtained in short views showing the injection.     A sterile dressing was applied.  Patient did tolerate procedure well.      ASSESSMENT & PLAN:   Capsulitis of right shoulder Acute on chronic in nature. Having pain similar to previous.  - counseled on home exercise therapy and supportive care -  injection today - waiting on MRI for now.

## 2021-07-09 NOTE — Patient Instructions (Signed)
Good to see you Please alternate heat and ice  Please let us know when you want to pursue an MRI   Please send me a message in MyChart with any questions or updates.  Please see me back in 2-3 months.   --Dr. Raeford Razor

## 2021-07-09 NOTE — Assessment & Plan Note (Signed)
Acute on chronic in nature. Having pain similar to previous.  - counseled on home exercise therapy and supportive care - injection today - waiting on MRI for now.

## 2021-08-02 ENCOUNTER — Telehealth: Payer: Self-pay

## 2021-08-02 NOTE — Telephone Encounter (Signed)
Telephoned patient at mobile number. Left a voice message with [facility] scheduling information.

## 2021-09-08 ENCOUNTER — Ambulatory Visit: Payer: Medicaid Other | Admitting: Family Medicine

## 2021-09-09 ENCOUNTER — Emergency Department (HOSPITAL_BASED_OUTPATIENT_CLINIC_OR_DEPARTMENT_OTHER)
Admission: EM | Admit: 2021-09-09 | Discharge: 2021-09-09 | Disposition: A | Discharge status: Home / Self Care | Payer: Self-pay | Attending: Emergency Medicine | Admitting: Emergency Medicine

## 2021-09-09 ENCOUNTER — Other Ambulatory Visit: Payer: Self-pay

## 2021-09-09 ENCOUNTER — Encounter (HOSPITAL_BASED_OUTPATIENT_CLINIC_OR_DEPARTMENT_OTHER): Payer: Self-pay

## 2021-09-09 ENCOUNTER — Emergency Department (HOSPITAL_BASED_OUTPATIENT_CLINIC_OR_DEPARTMENT_OTHER): Payer: Self-pay

## 2021-09-09 DIAGNOSIS — J189 Pneumonia, unspecified organism: Secondary | ICD-10-CM | POA: Insufficient documentation

## 2021-09-09 DIAGNOSIS — Z79899 Other long term (current) drug therapy: Secondary | ICD-10-CM | POA: Insufficient documentation

## 2021-09-09 DIAGNOSIS — Z7984 Long term (current) use of oral hypoglycemic drugs: Secondary | ICD-10-CM | POA: Insufficient documentation

## 2021-09-09 DIAGNOSIS — Z7982 Long term (current) use of aspirin: Secondary | ICD-10-CM | POA: Insufficient documentation

## 2021-09-09 DIAGNOSIS — F17210 Nicotine dependence, cigarettes, uncomplicated: Secondary | ICD-10-CM | POA: Insufficient documentation

## 2021-09-09 DIAGNOSIS — E119 Type 2 diabetes mellitus without complications: Secondary | ICD-10-CM | POA: Insufficient documentation

## 2021-09-09 DIAGNOSIS — Z20822 Contact with and (suspected) exposure to covid-19: Secondary | ICD-10-CM | POA: Insufficient documentation

## 2021-09-09 DIAGNOSIS — I10 Essential (primary) hypertension: Secondary | ICD-10-CM | POA: Insufficient documentation

## 2021-09-09 DIAGNOSIS — R051 Acute cough: Secondary | ICD-10-CM

## 2021-09-09 LAB — RESP PANEL BY RT-PCR (FLU A&B, COVID) ARPGX2
Influenza A by PCR: POSITIVE — AB
Influenza B by PCR: NEGATIVE
SARS Coronavirus 2 by RT PCR: NEGATIVE

## 2021-09-09 MED ORDER — AZITHROMYCIN 250 MG PO TABS: 250.0000 mg | ORAL_TABLET | Freq: Every day | ORAL | 0 refills | Status: DC

## 2021-09-09 MED ORDER — BENZONATATE 100 MG PO CAPS: 100.0000 mg | ORAL_CAPSULE | Freq: Three times a day (TID) | ORAL | 0 refills | Status: DC | PRN

## 2021-09-09 MED ORDER — AMOXICILLIN-POT CLAVULANATE 875-125 MG PO TABS: 1.0000 | ORAL_TABLET | Freq: Two times a day (BID) | ORAL | 0 refills | Status: DC

## 2021-09-09 NOTE — Discharge Instructions (Addendum)
[facility] today to be evaluated for your cough.  Your chest x-ray showed possible signs of pneumonia.  Due to this she was started on antibiotics.  Please take the antibiotics as prescribed.  I have also given you prescription for Tessalon, you may take 1 pill every 8 hours as needed to help with your cough.  You currently have testing for influenza and COVID-19 pending at this time.  You can find your results on your [facility] MyChart later this afternoon.  If positive for COVID-19 or influenza please self isolate at home for 5 days.  Additionally you will need to continue isolation until you are fever free for 24 hours without the use of any Tylenol or ibuprofen.  You may have diarrhea from the antibiotics.  It is very important that you continue to take the antibiotics even if you get diarrhea unless a medical professional tells you that you may stop taking them.  If you stop too early the bacteria you are being treated for will become stronger and you may need different, more powerful antibiotics that have more side effects and worsening diarrhea.  Please stay well hydrated and consider probiotics as they may decrease the severity of your diarrhea.  Please be aware that if you take any hormonal contraception (birth control pills, nexplanon, the ring, etc) that your birth control will not work while you are taking antibiotics and you need to use back up protection as directed on the birth control medication information insert.    Get help right away if: Your shortness of breath becomes worse. Your chest pain increases. Your sickness becomes worse, especially if you are an older adult or have a weak immune system. You cough up blood.

## 2021-09-09 NOTE — ED Triage Notes (Signed)
C/o cough, congestion x 2 weeks. Productive with clear phlegm.

## 2021-09-09 NOTE — ED Provider Notes (Signed)
[facility] Provider Note   CSN: [id number] Arrival date & time: 09/09/21  1006     History Chief Complaint  Patient presents with   Cough    Kristine Bell is a 50 y.o. female with history of diabetes mellitus, hypertension, hyperlipidemia, GERD.  Presents emergency department with a chief complaint of cough.  States that cough has been present over the last 2 to 3 weeks.  Cough is producing clear mucus.  Patient reports that first week of her symptoms she also had generalized body aches.  Patient endorses congestion and rhinorrhea over the last 2 weeks as well.  Describes rhinorrhea as clear mucus.  Patient reports that she is supposed to coworkers who are positive for flu and RSV.  Patient denies any fever, chills, sinus pain, sinus pressure, sore throat, nausea, vomiting, diarrhea, abdominal pain, chest pain, shortness of breath.   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   Hydronephrosis of left kidney 08/11/2015   Ureteral calculus, left 08/11/2015   Panic attack 03/09/2015   Pelvic pain in female 03/09/2015   Polyuria 03/09/2015   Primary insomnia 03/09/2015   Closed fracture of lateral malleolus 01/20/2015   Sprain of left ankle 01/20/2015   Chronic pain syndrome 12/02/2014   Atypical chest pain 11/11/2014   Hypercalcemia 11/11/2014   Bilateral carpal tunnel syndrome  10/23/2014   Encounter for monitoring opioid maintenance therapy 10/02/2014   Chronic constipation 07/15/2014   Diabetes mellitus type 2, controlled, without complications ([place]) 95/62/1308   Hypokalemia 07/15/2014   Polycystic ovarian syndrome 07/15/2014   Morbid obesity ([place]) 05/15/2014   Ovarian cyst 03/17/2014   Allergic rhinitis 12/31/2013   Hyposmolality and/or hyponatremia 12/31/2013   Iron deficiency anemia 12/31/2013   Leiomyoma of uterus 12/31/2013   Tobacco use disorder 12/31/2013   Urinary incontinence 12/31/2013   Excessive and frequent menstruation 09/18/2013   Malaise and fatigue 09/18/2013   Metrorrhagia 09/18/2013   Symptom associated with female genital organs 09/18/2013   Exostosis 08/26/2013   Essential hypertension 05/27/2013   Pain in joint involving lower leg 05/27/2013   Osteoarthritis of knee 05/27/2013   Cholelithiasis 03/11/2013   Edema 03/11/2013   Esophageal reflux 03/11/2013   Neck pain 03/11/2013   Backache 03/11/2013   Depressive disorder 02/16/2013   Dizziness 02/16/2013   Dysphagia 02/16/2013   Hypercholesterolemia 02/16/2013   Migraine 02/16/2013   Pain in thoracic spine 02/16/2013   Primary localized osteoarthrosis, lower leg 02/16/2013   Spasm of muscle 02/16/2013   Ventral hernia 02/16/2013    Past Surgical History:  Procedure Laterality Date   ABLATION ON ENDOMETRIOSIS     HERNIA REPAIR     LITHOTRIPSY     TUBAL LIGATION       OB History   No obstetric history on file.     Family History  Problem Relation Age of  Onset   Hyperlipidemia Father    Hypertension Father    Hypertension Brother    Cervical cancer Maternal Grandmother    Crohn's disease Brother     Social History   Tobacco Use   Smoking status: Every Day    Packs/day: 0.50    Types: Cigarettes   Smokeless tobacco: Never  Substance Use Topics   Alcohol use: No   Drug use: No    Home Medications Prior to Admission medications   Medication Sig Start  Date End Date Taking? Authorizing Provider  aspirin 81 MG chewable tablet Chew by mouth.    [provider]  atorvastatin (LIPITOR) 10 MG tablet Take 1 tablet (10 mg total) by mouth daily. 11/03/20   Saguier, Percell Miller, PA-C  benzonatate (TESSALON) 100 MG capsule Take 1 capsule (100 mg total) by mouth every 8 (eight) hours. Patient not taking: No sig reported 12/18/19   Tedd Sias, PA  buPROPion (WELLBUTRIN XL) 150 MG 24 hr tablet 1 tab po q day 11/03/20   Saguier, Percell Miller, PA-C  chlorthalidone (HYGROTON) 25 MG tablet Take 1 tablet (25 mg total) by mouth daily. 11/03/20   Saguier, Percell Miller, PA-C  cyclobenzaprine (FLEXERIL) 10 MG tablet Take 1 tablet (10 mg total) by mouth at bedtime. 11/26/19   Saguier, Percell Miller, PA-C  diclofenac sodium (VOLTAREN) 1 % GEL Apply 2 g topically 4 (four) times daily as needed. Patient not taking: No sig reported 07/15/19   Long, Wonda Olds, MD  losartan (COZAAR) 50 MG tablet Take 1 tablet (50 mg total) by mouth daily. 11/03/20   Saguier, Percell Miller, PA-C  metFORMIN (GLUCOPHAGE) 500 MG tablet Take 1 tablet (500 mg total) by mouth 2 (two) times daily with a meal. 11/03/20   Saguier, Percell Miller, PA-C  methocarbamol (ROBAXIN) 500 MG tablet Take 1 tablet (500 mg total) by mouth 2 (two) times daily as needed for muscle spasms. 12/29/20   Margarita Mail, PA-C  methylPREDNISolone (MEDROL) 4 MG tablet 5 tab po am and pm 4 tab po am and pm 3 tab po am and pm 2 tab po am and pm 1 tab po am and pm Patient not taking: No sig reported 12/03/19   Saguier, Percell Miller, PA-C  Multiple Vitamin (MULTI-VITAMIN) tablet Take 1 tablet by mouth daily.    [provider]  omeprazole (PRILOSEC) 20 MG capsule Take 1 capsule (20 mg total) by mouth daily. 11/03/20   Saguier, Percell Miller, PA-C  ondansetron (ZOFRAN ODT) 4 MG disintegrating tablet Take 1 tablet (4 mg total) by mouth every 8 (eight) hours as needed for nausea or vomiting. Patient not taking: No sig reported 08/24/20   Sherol Dade E,  PA-C  oxybutynin (DITROPAN) 5 MG tablet Take 5 mg by mouth 2 (two) times daily. 09/01/20   [provider]  oxyCODONE-acetaminophen (PERCOCET/ROXICET) 5-325 MG tablet Take 1 tablet by mouth every 4 (four) hours as needed for severe pain. Patient not taking: No sig reported 08/25/20   Barrie Folk, PA-C  SUMAtriptan (IMITREX) 50 MG tablet Take 1 tablet (50 mg total) by mouth every 2 (two) hours as needed for migraine. May repeat in 2 hours if headache persists or recurs. Patient not taking: No sig reported 11/27/19   Saguier, Percell Miller, PA-C  traZODone (DESYREL) 50 MG tablet Take 0.5-1 tablets (25-50 mg total) by mouth at bedtime as needed for sleep. 11/03/20   Saguier, Percell Miller, PA-C    Allergies    Naproxen sodium, Naproxen, Sertraline, and Tramadol  Review of Systems  Review of Systems  Constitutional:  Negative for chills and fever.  HENT:  Positive for congestion and rhinorrhea. Negative for drooling, ear discharge, ear pain, facial swelling, sinus pressure, sinus pain, sore throat and trouble swallowing.   Eyes:  Negative for visual disturbance.  Respiratory:  Positive for cough. Negative for shortness of breath.   Cardiovascular:  Negative for chest pain.  Gastrointestinal:  Negative for abdominal pain, diarrhea, nausea and vomiting.  Musculoskeletal:  Negative for back pain and neck pain.  Skin:  Negative for color change and rash.  Neurological:  Negative for dizziness, syncope, light-headedness and headaches.  Psychiatric/Behavioral:  Negative for confusion.    Physical Exam Updated Vital Signs BP (!) 153/92 (BP Location: Left Arm)    Pulse 84    Temp 98.7 F (37.1 C) (Oral)    Resp 18    Ht 5\' 5"  (1.651 m)    Wt (!) 155 kg    SpO2 93%    BMI 56.86 kg/m   Physical Exam Vitals and nursing note reviewed.  Constitutional:      General: She is not in acute distress.    Appearance: She is not ill-appearing, toxic-appearing or diaphoretic.  HENT:     Head:  Normocephalic.     Nose:     Right Sinus: No maxillary sinus tenderness or frontal sinus tenderness.     Left Sinus: No maxillary sinus tenderness or frontal sinus tenderness.     Mouth/Throat:     Lips: Pink. No lesions.     Mouth: Mucous membranes are moist.     Tongue: No lesions. Tongue does not deviate from midline.     Palate: No mass and lesions.     Pharynx: Oropharynx is clear. No pharyngeal swelling, oropharyngeal exudate, posterior oropharyngeal erythema or uvula swelling.     Tonsils: No tonsillar exudate or tonsillar abscesses. 1+ on the right. 1+ on the left.  Eyes:     General: No scleral icterus.       Right eye: No discharge.        Left eye: No discharge.  Cardiovascular:     Rate and Rhythm: Normal rate.  Pulmonary:     Effort: Pulmonary effort is normal. No tachypnea, bradypnea or respiratory distress.     Breath sounds: Normal breath sounds. No stridor. No decreased breath sounds, wheezing, rhonchi or rales.     Comments: Speaks in full complete sentences without difficulty Abdominal:     General: There is no distension. There are no signs of injury.     Palpations: Abdomen is soft. There is no mass or pulsatile mass.     Tenderness: There is no abdominal tenderness. There is no guarding or rebound.  Skin:    General: Skin is warm and dry.  Neurological:     General: No focal deficit present.     Mental Status: She is alert.  Psychiatric:        Behavior: Behavior is cooperative.    ED Results / Procedures / Treatments   Labs (all labs ordered are listed, but only abnormal results are displayed) Labs Reviewed  RESP PANEL BY RT-PCR (FLU A&B, COVID) [id number]    EKG None  Radiology DG Chest Portable 1 View  Result Date: 09/09/2021 CLINICAL DATA:  cough x 2 weeks, shortness of breath EXAM: PORTABLE CHEST 1 VIEW COMPARISON:  Chest radiograph 09/15/2018 FINDINGS: Streaky bibasilar opacities. No visible pleural effusions or pneumothorax. Similar enlarged  cardiac silhouette. IMPRESSION: 1. Streaky bibasilar opacities, which  could represent atelectasis and/or pneumonia. 2. Similar cardiomegaly. Electronically Signed   By: Margaretha Sheffield M.D.   On: 09/09/2021 10:32    Procedures Procedures   Medications Ordered in ED Medications - No data to display  ED Course  I have reviewed the triage vital signs and the nursing notes.  Pertinent labs & imaging results that were available during my care of the patient were reviewed by me and considered in my medical decision making (see chart for details).    MDM Rules/Calculators/A&P                          Alert 50 year old female no acute distress, nontoxic-appearing.  Presents emergency department with a chief complaint of productive cough x2 to 3 weeks.  Patient additionally complains of rhinorrhea and nasal congestion.  Denies any sinus pain or sinus pressure.  No tenderness to maxillary or frontal sinuses on exam.  Suspicion for acute sinusitis at this time.  Lungs clear to auscultation bilaterally.  Patient speaks in full complete sentences without difficulty.  Chest x-ray shows streaky bibasilar opacities which could represent atelectasis versus pneumonia.  Due to concern for pneumonia we will start patient on antibiotic therapy of azithromycin and Augmentin due to patient's comorbidities.  Additionally will prescribe patient with Tessalon for her cough.  COVID-19 and influenza testing pending at this time.  Patient given information on self-isolation and symptomatic treatment if positive for COVID-19 or influenza  Discussed results, findings, treatment and follow up. Patient advised of return precautions. Patient verbalized understanding and agreed with plan.  Kristine Bell was evaluated in Emergency Department on 09/09/2021 for the symptoms described in the history of present illness. She was evaluated in the context of the global COVID-19 pandemic, which necessitated consideration that the  patient might be at risk for infection with the SARS-CoV-2 virus that causes COVID-19. Institutional protocols and algorithms that pertain to the evaluation of patients at risk for COVID-19 are in a state of rapid change based on information released by regulatory bodies including the CDC and federal and state organizations. These policies and algorithms were followed during the patient's care in the ED.      Final Clinical Impression(s) / ED Diagnoses Final diagnoses:  Acute cough  Community acquired pneumonia, unspecified laterality    Rx / DC Orders ED Discharge Orders          Ordered    benzonatate (TESSALON) 100 MG capsule  Every 8 hours PRN        09/09/21 1045    azithromycin (ZITHROMAX) 250 MG tablet  Daily        09/09/21 1048    amoxicillin-clavulanate (AUGMENTIN) 875-125 MG tablet  Every 12 hours        09/09/21 1048             Dyann Ruddle 09/09/21 1050    Hayden Rasmussen, MD 09/09/21 1756

## 2021-11-04 ENCOUNTER — Other Ambulatory Visit: Payer: Self-pay | Admitting: Medical

## 2022-01-07 ENCOUNTER — Encounter (HOSPITAL_BASED_OUTPATIENT_CLINIC_OR_DEPARTMENT_OTHER): Payer: Self-pay | Admitting: Emergency Medicine

## 2022-01-07 ENCOUNTER — Emergency Department (HOSPITAL_BASED_OUTPATIENT_CLINIC_OR_DEPARTMENT_OTHER)
Admission: EM | Admit: 2022-01-07 | Discharge: 2022-01-07 | Disposition: A | Discharge status: Home / Self Care | Payer: Medicaid Other | Attending: Emergency Medicine | Admitting: Emergency Medicine

## 2022-01-07 ENCOUNTER — Other Ambulatory Visit: Payer: Self-pay

## 2022-01-07 DIAGNOSIS — L0201 Cutaneous abscess of face: Secondary | ICD-10-CM

## 2022-01-07 DIAGNOSIS — L723 Sebaceous cyst: Secondary | ICD-10-CM | POA: Insufficient documentation

## 2022-01-07 DIAGNOSIS — L089 Local infection of the skin and subcutaneous tissue, unspecified: Secondary | ICD-10-CM

## 2022-01-07 DIAGNOSIS — Z7982 Long term (current) use of aspirin: Secondary | ICD-10-CM | POA: Insufficient documentation

## 2022-01-07 MED ORDER — LIDOCAINE-EPINEPHRINE (PF) 2 %-1:200000 IJ SOLN
10.0000 mL | Freq: Once | INTRAMUSCULAR | Status: AC
  Administered 2022-01-07: 10 mL
  Filled 2022-01-07: qty 20

## 2022-01-07 MED ORDER — DOXYCYCLINE HYCLATE 100 MG PO CAPS: 100.0000 mg | ORAL_CAPSULE | Freq: Two times a day (BID) | ORAL | 0 refills | Status: DC

## 2022-01-07 NOTE — ED Provider Notes (Signed)
?[facility] ?Provider Note ? ? ?CSN: [id number] ?Arrival date & time: 01/07/22  0906 ? ?  ? ?History ? ?Chief Complaint  ?Patient presents with  ? Abscess  ? ? ?Kristine Bell is a 51 y.o. female. ? ?Patient presents to the emergency department for evaluation of an inflamed sebaceous cyst on the right cheek.  She has had this in the past.  Typically they will get better on their own.  This area became inflamed and more painful and swollen 3 days ago.  No fevers, nausea or vomiting.  No treatments prior to arrival.  Minimal active spontaneous drainage. ? ? ?  ? ?Home Medications ?Prior to Admission medications   ?Medication Sig Start Date End Date Taking? Authorizing Provider  ?aspirin 81 MG chewable tablet Chew by mouth.   Yes [provider]  ?atorvastatin (LIPITOR) 10 MG tablet Take 1 tablet (10 mg total) by mouth daily. 11/03/20  Yes Saguier, Percell Miller, PA-C  ?losartan (COZAAR) 50 MG tablet Take 1 tablet (50 mg total) by mouth daily. 11/03/20  Yes Saguier, Percell Miller, PA-C  ?metFORMIN (GLUCOPHAGE) 500 MG tablet Take 1 tablet (500 mg total) by mouth 2 (two) times daily with a meal. 11/03/20  Yes Saguier, Percell Miller, PA-C  ?Multiple Vitamin (MULTI-VITAMIN) tablet Take 1 tablet by mouth daily.   Yes [provider]  ?amoxicillin-clavulanate (AUGMENTIN) 875-125 MG tablet Take 1 tablet by mouth every 12 (twelve) hours. 09/09/21   Loni Beckwith, PA-C  ?azithromycin (ZITHROMAX) 250 MG tablet Take 1 tablet (250 mg total) by mouth daily. Take first 2 tablets together, then 1 every day until finished. 09/09/21   Loni Beckwith, PA-C  ?benzonatate (TESSALON) 100 MG capsule Take 1 capsule (100 mg total) by mouth every 8 (eight) hours as needed for cough. 09/09/21   Loni Beckwith, PA-C  ?buPROPion (WELLBUTRIN XL) 150 MG 24 hr tablet 1 tab po q day 11/03/20   Saguier, Percell Miller, PA-C  ?chlorthalidone (HYGROTON) 25 MG tablet Take 1 tablet (25 mg total) by mouth daily. 11/03/20    Saguier, Percell Miller, PA-C  ?cyclobenzaprine (FLEXERIL) 10 MG tablet Take 1 tablet (10 mg total) by mouth at bedtime. 11/26/19   Saguier, Percell Miller, PA-C  ?diclofenac sodium (VOLTAREN) 1 % GEL Apply 2 g topically 4 (four) times daily as needed. ?Patient not taking: No sig reported 07/15/19   Long, Wonda Olds, MD  ?methocarbamol (ROBAXIN) 500 MG tablet Take 1 tablet (500 mg total) by mouth 2 (two) times daily as needed for muscle spasms. 12/29/20   Margarita Mail, PA-C  ?methylPREDNISolone (MEDROL) 4 MG tablet 5 tab po am and pm ?4 tab po am and pm ?3 tab po am and pm ?2 tab po am and pm ?1 tab po am and pm ?Patient not taking: Reported on 11/03/2020 12/03/19   Saguier, Percell Miller, PA-C  ?omeprazole (PRILOSEC) 20 MG capsule Take 1 capsule (20 mg total) by mouth daily. 11/03/20   Saguier, Percell Miller, PA-C  ?ondansetron (ZOFRAN ODT) 4 MG disintegrating tablet Take 1 tablet (4 mg total) by mouth every 8 (eight) hours as needed for nausea or vomiting. ?Patient not taking: Reported on 11/03/2020 08/24/20   Barrie Folk, PA-C  ?oxybutynin (DITROPAN) 5 MG tablet Take 5 mg by mouth 2 (two) times daily. 09/01/20   [provider]  ?oxyCODONE-acetaminophen (PERCOCET/ROXICET) 5-325 MG tablet Take 1 tablet by mouth every 4 (four) hours as needed for severe pain. ?Patient not taking: No sig reported 08/25/20   Barrie Folk, PA-C  ?  SUMAtriptan (IMITREX) 50 MG tablet Take 1 tablet (50 mg total) by mouth every 2 (two) hours as needed for migraine. May repeat in 2 hours if headache persists or recurs. ?Patient not taking: No sig reported 11/27/19   Saguier, Percell Miller, PA-C  ?traZODone (DESYREL) 50 MG tablet Take 0.5-1 tablets (25-50 mg total) by mouth at bedtime as needed for sleep. 11/03/20   Saguier, Percell Miller, PA-C  ?   ? ?Allergies    ?Naproxen sodium, Naproxen, Sertraline, and Tramadol   ? ?Review of Systems   ?Review of Systems ? ?Physical Exam ?Updated Vital Signs ?BP (!) 168/92   Pulse 72   Temp 97.6 ?F (36.4 ?C) (Oral)    Resp 20   Ht '5\' 5"'$  (1.651 m)   Wt (!) 140.6 kg   SpO2 99%   BMI 51.59 kg/m?  ?Physical Exam ?Vitals and nursing note reviewed.  ?Constitutional:   ?   Appearance: She is well-developed.  ?HENT:  ?   Head: Normocephalic and atraumatic.  ?   Comments: Patient with 1-2 cm area of induration consistent with an inflamed sebaceous cyst on the posterior right cheek.  There is a small amount of thick drainage able to be expressed from an area that has opened up.  Minimal surrounding cellulitis. ?Eyes:  ?   Conjunctiva/sclera: Conjunctivae normal.  ?Pulmonary:  ?   Effort: No respiratory distress.  ?Musculoskeletal:  ?   Cervical back: Normal range of motion and neck supple.  ?Skin: ?   General: Skin is warm and dry.  ?Neurological:  ?   Mental Status: She is alert.  ? ? ?ED Results / Procedures / Treatments   ?Labs ?(all labs ordered are listed, but only abnormal results are displayed) ?Labs Reviewed - No data to display ? ?EKG ?None ? ?Radiology ?No results found. ? ?Procedures ?Marland Kitchen.Incision and Drainage ? ?Date/Time: 01/07/2022 10:37 AM ?Performed by: Carlisle Cater, PA-C ?Authorized by: Carlisle Cater, PA-C  ? ?Consent:  ?  Consent obtained:  Verbal ?  Consent given by:  Patient ?  Risks discussed:  Bleeding, incomplete drainage, pain and infection ?  Alternatives discussed:  No treatment (abx only) ?Universal protocol:  ?  Patient identity confirmed:  Verbally with patient ?Location:  ?  Type:  Cyst ?  Size:  2cm ?  Location:  Head ?  Head location:  Face ?Pre-procedure details:  ?  Skin preparation:  Povidone-iodine ?Sedation:  ?  Sedation type:  None ?Anesthesia:  ?  Anesthesia method:  Local infiltration ?  Local anesthetic:  Lidocaine 2% WITH epi ?Procedure type:  ?  Complexity:  Simple ?Procedure details:  ?  Needle aspiration: yes   ?  Needle size:  18 G ?  Incision types:  Stab incision ?  Incision depth:  Dermal ?  Wound management:  Probed and deloculated ?  Drainage:  Purulent ?  Drainage amount:  Moderate ?   Wound treatment:  Wound left open ?  Packing materials:  None ?Post-procedure details:  ?  Procedure completion:  Tolerated well, no immediate complications ?Comments:  ?   Patient tolerated anesthesia well.  Attempt was made at needle aspiration with 18-gauge needle through the area where the wound was already draining when pressure was applied.  I was not able to aspirate any substantial amount of drainage.  With the patient's permission, I made an incision approximately 1/8 of an inch in length at the posterior portion of the cyst.  This allowed me to probe and deloculated the  cyst with good drainage of purulent and waxy material.  Cyst seemed to be well drained after several rounds of firm pressure.  Wound was minimally gaping.  Due to its location on the face, I closed with #1 5-0 Vicryl riprapide suture.  ? ? ?Medications Ordered in ED ?Medications  ?lidocaine-EPINEPHrine (XYLOCAINE W/EPI) 2 %-1:200000 (PF) injection 10 mL (has no administration in time range)  ? ? ?ED Course/ Medical Decision Making/ A&P ?  ? ?Patient seen and examined. History obtained directly from patient.  ? ?Labs/EKG: None ordered ? ?Imaging: None ordered ? ?Medications/Fluids: Lidocaine 2% with epinephrine ? ?Most recent vital signs reviewed and are as follows: ?BP (!) 168/92   Pulse 72   Temp 97.6 ?F (36.4 ?C) (Oral)   Resp 20   Ht '5\' 5"'$  (1.651 m)   Wt (!) 140.6 kg   SpO2 99%   BMI 51.59 kg/m?  ? ?Initial impression: Inflamed sebaceous cyst with infection.  Discussed attempted needle aspiration and possibly small incision depending upon success of drainage.  Discussed potential cosmetic concerns. Patient agrees to proceed. ? ?10:40 AM Reassessment performed. Patient appears comfortable. ? ?Wound aspiration/drainage performed per procedure note.  Patient tolerated well. ? ?Most current vital signs reviewed and are as follows: ?BP (!) 168/92   Pulse 72   Temp 97.6 ?F (36.4 ?C) (Oral)   Resp 20   Ht '5\' 5"'$  (1.651 m)   Wt (!)  140.6 kg   SpO2 99%   BMI 51.59 kg/m?  ? ?Plan: Discharge to home.  ? ?Prescriptions written for: Doxycycline ? ?Other home care instructions discussed: OTC pain medications, warm compresses.  Discussed removal of observable s

## 2022-01-07 NOTE — ED Triage Notes (Signed)
Patient has a abscess on her rt cheek . States that it has happen for years. It comes an go. States this abcess came up two days ago. Denies any fever. ?

## 2022-01-07 NOTE — Discharge Instructions (Addendum)
Please read and follow all provided instructions. ? ?Your diagnoses today include:  ?1. Infected sebaceous cyst of skin   ?2. Facial abscess   ? ? ?Tests performed today include: ?Vital signs. See below for your results today.  ? ?Medications prescribed:  ?Doxycycline - antibiotic ? ?You have been prescribed an antibiotic medicine: take the entire course of medicine even if you are feeling better. Stopping early can cause the antibiotic not to work. ? ?Take any prescribed medications only as directed.  ? ?Home care instructions:  ?Follow any educational materials contained in this packet ?The suture placed will be able to be gently pulled out as it weakens over the next few days.  ? ?Follow-up instructions: ?Return to the Emergency Department in 48 hours for a recheck if your symptoms are not significantly improved. ? ?Return instructions:  ?Return to the Emergency Department if you have: ?Fever ?Worsening symptoms ?Worsening pain ?Worsening swelling ?Redness of the skin that moves away from the affected area, especially if it streaks away from the affected area  ?Any other emergent concerns ? ? ?Your vital signs today were: ?BP (!) 168/92   Pulse 72   Temp 97.6 ?F (36.4 ?C) (Oral)   Resp 20   Ht '5\' 5"'$  (1.651 m)   Wt (!) 140.6 kg   SpO2 99%   BMI 51.59 kg/m?  ?If your blood pressure (BP) was elevated above 135/85 this visit, please have this repeated by your doctor within one month. ?-------------- ? ?

## 2022-06-19 ENCOUNTER — Encounter (HOSPITAL_BASED_OUTPATIENT_CLINIC_OR_DEPARTMENT_OTHER): Payer: Self-pay | Admitting: Emergency Medicine

## 2022-06-19 ENCOUNTER — Emergency Department (HOSPITAL_BASED_OUTPATIENT_CLINIC_OR_DEPARTMENT_OTHER)
Admission: EM | Admit: 2022-06-19 | Discharge: 2022-06-19 | Disposition: A | Discharge status: Home / Self Care | Payer: Self-pay | Attending: Emergency Medicine | Admitting: Emergency Medicine

## 2022-06-19 ENCOUNTER — Emergency Department (HOSPITAL_BASED_OUTPATIENT_CLINIC_OR_DEPARTMENT_OTHER): Payer: Self-pay

## 2022-06-19 DIAGNOSIS — Z7982 Long term (current) use of aspirin: Secondary | ICD-10-CM | POA: Insufficient documentation

## 2022-06-19 DIAGNOSIS — M25512 Pain in left shoulder: Secondary | ICD-10-CM | POA: Insufficient documentation

## 2022-06-19 MED ORDER — OXYCODONE HCL 5 MG PO TABS
5.0000 mg | ORAL_TABLET | Freq: Once | ORAL | Status: AC
  Administered 2022-06-19: 5 mg via ORAL
  Filled 2022-06-19: qty 1

## 2022-06-19 MED ORDER — KETOROLAC TROMETHAMINE 15 MG/ML IJ SOLN
30.0000 mg | Freq: Once | INTRAMUSCULAR | Status: AC
  Administered 2022-06-19: 30 mg via INTRAMUSCULAR
  Filled 2022-06-19: qty 2

## 2022-06-19 MED ORDER — CYCLOBENZAPRINE HCL 10 MG PO TABS: 10.0000 mg | ORAL_TABLET | Freq: Two times a day (BID) | ORAL | 0 refills | Status: DC | PRN

## 2022-06-19 MED ORDER — MELOXICAM 7.5 MG PO TABS: 7.5000 mg | ORAL_TABLET | Freq: Every day | ORAL | 0 refills | Status: DC

## 2022-06-19 NOTE — ED Provider Notes (Signed)
[facility] EMERGENCY DEPARTMENT Provider Note   CSN: [id number] Arrival date & time: 06/19/22  1217     History  Chief Complaint  Patient presents with   Arm Pain    Kristine Bell is a 51 y.o. female.  Patient presents the emergency department for evaluation of left shoulder and upper back pain.  Patient has a history of chronic pain in her shoulders.  She states that she has received injections in the shoulders from sports medicine, which helps temporarily.  She denies new injuries but over the past several days the pain has become worse in the left shoulder.  No distal numbness, tingling, or weakness in the left upper extremity or hand.  Was recommended as outpatient to get MRI, however she does not yet have insurance to cover this.  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  She is unable to raise her left arm above shoulder height at this point time.  She has been taking ibuprofen without much improvement.       Home Medications Prior to Admission medications   Medication Sig Start Date End Date Taking? Authorizing Provider  cyclobenzaprine (FLEXERIL) 10 MG tablet Take 1 tablet (10 mg total) by mouth 2 (two) times daily as needed for muscle spasms. 06/19/22  Yes Carlisle Cater, PA-C  meloxicam (MOBIC) 7.5 MG tablet Take 1 tablet (7.5 mg total) by mouth daily. 06/19/22  Yes Carlisle Cater, PA-C  aspirin 81 MG chewable tablet Chew by mouth.    [provider]  atorvastatin (LIPITOR) 10 MG tablet Take 1 tablet (10 mg total) by mouth daily. 11/03/20   Saguier, Percell Miller, PA-C  buPROPion (WELLBUTRIN XL) 150 MG 24 hr tablet 1 tab po q day 11/03/20   Saguier, Percell Miller, PA-C  chlorthalidone (HYGROTON) 25 MG tablet Take 1 tablet (25 mg total) by mouth daily. 11/03/20   Saguier, Percell Miller, PA-C  losartan (COZAAR) 50 MG tablet Take 1 tablet (50 mg total) by mouth daily. 11/03/20   Saguier, Percell Miller, PA-C  metFORMIN (GLUCOPHAGE) 500 MG tablet Take 1 tablet (500  mg total) by mouth 2 (two) times daily with a meal. 11/03/20   Saguier, Percell Miller, PA-C  Multiple Vitamin (MULTI-VITAMIN) tablet Take 1 tablet by mouth daily.    [provider]  omeprazole (PRILOSEC) 20 MG capsule Take 1 capsule (20 mg total) by mouth daily. 11/03/20   Saguier, Percell Miller, PA-C  oxybutynin (DITROPAN) 5 MG tablet Take 5 mg by mouth 2 (two) times daily. 09/01/20   [provider]  traZODone (DESYREL) 50 MG tablet Take 0.5-1 tablets (25-50 mg total) by mouth at bedtime as needed for sleep. 11/03/20   Saguier, Percell Miller, PA-C      Allergies    Naproxen sodium, Naproxen, Sertraline, and Tramadol    Review of Systems   Review of Systems  Physical Exam Updated Vital Signs BP (!) 164/88 (BP Location: Right Arm)   Pulse 77   Temp 98.1 F (36.7 C) (Oral)   Resp 20   Ht '5\' 5"'$  (1.651 m)   Wt (!) 158.8 kg   SpO2 96%   BMI 58.24 kg/m   Physical Exam Vitals and nursing note reviewed.  Constitutional:      Appearance: She is well-developed.  HENT:     Head: Normocephalic and atraumatic.  Eyes:     Conjunctiva/sclera: Conjunctivae normal.  Pulmonary:     Effort: No respiratory distress.  Musculoskeletal:     Right shoulder: No tenderness.  Left shoulder: Tenderness present. No swelling. Decreased range of motion.     Cervical back: Normal range of motion and neck supple. Tenderness present. No bony tenderness. Normal range of motion.     Thoracic back: Tenderness present. Normal range of motion.     Lumbar back: No tenderness.     Comments: Patient with anterior left shoulder tenderness, tenderness over the paraspinous musculature of the cervical spine and of the trapezius on the left.  Patient is unable to abduct past shoulder height.  Skin:    General: Skin is warm and dry.  Neurological:     Mental Status: She is alert.     ED Results / Procedures / Treatments   Labs (all labs ordered are listed, but only abnormal results are displayed) Labs Reviewed -  No data to display  EKG None  Radiology DG Shoulder Left  Result Date: 06/19/2022 CLINICAL DATA:  Left shoulder pain. Unable to raise her left upper extremity. EXAM: LEFT SHOULDER - 3 VIEW COMPARISON:  One-view chest x-ray 09/09/2021 FINDINGS: Shoulder is located. Degenerative changes are present at the [facility] joint. No acute or focal osseous abnormality is present. The visualized hemithorax is clear. IMPRESSION: Degenerative changes at the [facility] joint. Electronically Signed   By: San Morelle M.D.   On: 06/19/2022 14:04    Procedures Procedures    Medications Ordered in ED Medications  ketorolac (TORADOL) 15 MG/ML injection 30 mg (30 mg Intramuscular Given 06/19/22 1336)  oxyCODONE (Oxy IR/ROXICODONE) immediate release tablet 5 mg (5 mg Oral Given 06/19/22 1335)    ED Course/ Medical Decision Making/ A&P    Patient seen and examined. History obtained directly from patient.   Labs/EKG: None ordered  Imaging: Ordered x-ray of the left shoulder.  Medications/Fluids: Ordered: IM Toradol, p.o. oxycodone  Most recent vital signs reviewed and are as follows: BP (!) 164/88 (BP Location: Right Arm)   Pulse 77   Temp 98.1 F (36.7 C) (Oral)   Resp 20   Ht '5\' 5"'$  (1.651 m)   Wt (!) 158.8 kg   SpO2 96%   BMI 58.24 kg/m   Initial impression: Left shoulder pain, acute on chronic  2:35 PM Reassessment performed. Patient appears stable.  Imaging personally visualized and interpreted including: X-ray of the left shoulder, agree no fracture or dislocation  Reviewed pertinent lab work and imaging with patient at bedside. Questions answered.   Most current vital signs reviewed and are as follows: BP (!) 164/88 (BP Location: Right Arm)   Pulse 77   Temp 98.1 F (36.7 C) (Oral)   Resp 20   Ht '5\' 5"'$  (1.651 m)   Wt (!) 158.8 kg   SpO2 96%   BMI 58.24 kg/m   Plan: Discharge to home.   Prescriptions written for: Flexeril, meloxicam  Other home care instructions discussed: RICE  protocol, heat  ED return instructions discussed: Worsening pain, new symptoms or other concerns  Follow-up instructions discussed: Patient encouraged to follow-up with their orthopedist/PCP in 1 week.                          Medical Decision Making Amount and/or Complexity of Data Reviewed Radiology: ordered.  Risk Prescription drug management.   Patient with acute on chronic left shoulder pain.  No new injuries.  X-rays negative for dislocation or fracture.  Left upper extremity neurovascular exam is reassuring.  No signs of compartment syndrome.  No indication for emergent orthopedic consultation at  this time.  The patient's vital signs, pertinent lab work and imaging were reviewed and interpreted as discussed in the ED course. Hospitalization was considered for further testing, treatments, or serial exams/observation. However as patient is well-appearing, has a stable exam, and reassuring studies today, I do not feel that they warrant admission at this time. This plan was discussed with the patient who verbalizes agreement and comfort with this plan and seems reliable and able to return to the Emergency Department with worsening or changing symptoms.          Final Clinical Impression(s) / ED Diagnoses Final diagnoses:  Acute pain of left shoulder    Rx / DC Orders ED Discharge Orders          Ordered    cyclobenzaprine (FLEXERIL) 10 MG tablet  2 times daily PRN        06/19/22 1429    meloxicam (MOBIC) 7.5 MG tablet  Daily        06/19/22 1429              Carlisle Cater, PA-C 06/19/22 1436    Fransico Meadow, MD 06/19/22 1720

## 2022-06-19 NOTE — Discharge Instructions (Signed)
Please read and follow all provided instructions.  Your diagnoses today include:  1. Acute pain of left shoulder     Tests performed today include: An x-ray of the affected area - does NOT show any broken bones, does show some arthritis in the [facility] joint Vital signs. See below for your results today.   Medications prescribed:  Meloxicam - anti-inflammatory pain medication  You have been prescribed an anti-inflammatory medication or NSAID. Take with food. Do not take aspirin, ibuprofen, or naproxen if taking this medication. Take smallest effective dose for the shortest duration needed for your pain. Stop taking if you experience stomach pain or vomiting.   Flexeril (cyclobenzaprine) - muscle relaxer medication  DO NOT drive or perform any activities that require you to be awake and alert because this medicine can make you drowsy.   Take any prescribed medications only as directed.  Home care instructions:  Follow any educational materials contained in this packet Follow R.I.C.E. Protocol: R - rest your injury  I  - use ice on injury without applying directly to skin C - compress injury with bandage or splint E - elevate the injury as much as possible  Follow-up instructions: Please follow-up with your primary care provider or the provided orthopedic physician (bone specialist) if you continue to have significant pain in 1 week. In this case you may have a more severe injury that requires further care.   Return instructions:  Please return if your fingers are numb or tingling, appear gray or blue, or you have severe pain (also elevate the arm and loosen splint or wrap if you were given one) Please return to the Emergency Department if you experience worsening symptoms.  Please return if you have any other emergent concerns.  Additional Information:  Your vital signs today were: BP (!) 164/88 (BP Location: Right Arm)   Pulse 77   Temp 98.1 F (36.7 C) (Oral)   Resp 20   Ht 5'  5" (1.651 m)   Wt (!) 158.8 kg   SpO2 96%   BMI 58.24 kg/m  If your blood pressure (BP) was elevated above 135/85 this visit, please have this repeated by your doctor within one month. --------------

## 2022-06-19 NOTE — ED Notes (Signed)
Patient states she is having severe left shoulder, arm and neck pain. Pt states the pain radiates to her back. Pt also states she has pain in her chest, left side. I palpated same in which caused her great discomfort. Patient woke up Friday morning and was unable to move. Has been going to sports medicine for shots. Has been taking IBU for pain with no relief.

## 2022-06-19 NOTE — ED Triage Notes (Signed)
Pt report LUE pain that is worse; she has had BUE pain for sometime and is being worked up for same; MD wants her to have MRI, but she does not have insurance and can't afford the test; sts can't raise LUE and pain was preventing sleep last pm

## 2022-08-30 ENCOUNTER — Other Ambulatory Visit: Payer: Self-pay

## 2022-08-30 ENCOUNTER — Emergency Department (HOSPITAL_BASED_OUTPATIENT_CLINIC_OR_DEPARTMENT_OTHER)
Admission: EM | Admit: 2022-08-30 | Discharge: 2022-08-30 | Disposition: A | Discharge status: Home / Self Care | Payer: BC Managed Care – PPO | Attending: Emergency Medicine | Admitting: Emergency Medicine

## 2022-08-30 ENCOUNTER — Emergency Department (HOSPITAL_BASED_OUTPATIENT_CLINIC_OR_DEPARTMENT_OTHER): Payer: BC Managed Care – PPO

## 2022-08-30 DIAGNOSIS — E119 Type 2 diabetes mellitus without complications: Secondary | ICD-10-CM | POA: Insufficient documentation

## 2022-08-30 DIAGNOSIS — R059 Cough, unspecified: Secondary | ICD-10-CM | POA: Diagnosis present

## 2022-08-30 DIAGNOSIS — J101 Influenza due to other identified influenza virus with other respiratory manifestations: Secondary | ICD-10-CM | POA: Insufficient documentation

## 2022-08-30 DIAGNOSIS — Z7984 Long term (current) use of oral hypoglycemic drugs: Secondary | ICD-10-CM | POA: Diagnosis not present

## 2022-08-30 DIAGNOSIS — J40 Bronchitis, not specified as acute or chronic: Secondary | ICD-10-CM | POA: Diagnosis not present

## 2022-08-30 DIAGNOSIS — M940 Chondrocostal junction syndrome [Tietze]: Secondary | ICD-10-CM | POA: Diagnosis not present

## 2022-08-30 DIAGNOSIS — Z1152 Encounter for screening for COVID-19: Secondary | ICD-10-CM | POA: Diagnosis not present

## 2022-08-30 DIAGNOSIS — Z7982 Long term (current) use of aspirin: Secondary | ICD-10-CM | POA: Insufficient documentation

## 2022-08-30 DIAGNOSIS — J111 Influenza due to unidentified influenza virus with other respiratory manifestations: Secondary | ICD-10-CM

## 2022-08-30 LAB — RESP PANEL BY RT-PCR (RSV, FLU A&B, COVID)  RVPGX2
Influenza A by PCR: POSITIVE — AB
Influenza B by PCR: NEGATIVE
Resp Syncytial Virus by PCR: NEGATIVE
SARS Coronavirus 2 by RT PCR: NEGATIVE

## 2022-08-30 MED ORDER — LIDOCAINE 4 % EX PTCH: 2.0000 | MEDICATED_PATCH | CUTANEOUS | 0 refills | Status: AC

## 2022-08-30 MED ORDER — ALBUTEROL SULFATE HFA 108 (90 BASE) MCG/ACT IN AERS: 1.0000 | INHALATION_SPRAY | Freq: Four times a day (QID) | RESPIRATORY_TRACT | 0 refills | Status: AC | PRN

## 2022-08-30 MED ORDER — OSELTAMIVIR PHOSPHATE 75 MG PO CAPS: 75.0000 mg | ORAL_CAPSULE | Freq: Two times a day (BID) | ORAL | 0 refills | Status: AC

## 2022-08-30 MED ORDER — LIDOCAINE 5 % EX PTCH
2.0000 | MEDICATED_PATCH | CUTANEOUS | Status: DC
  Administered 2022-08-30: 2 via TRANSDERMAL
  Filled 2022-08-30: qty 2

## 2022-08-30 MED ORDER — ACETAMINOPHEN 325 MG PO TABS
650.0000 mg | ORAL_TABLET | Freq: Once | ORAL | Status: AC
  Administered 2022-08-30: 650 mg via ORAL
  Filled 2022-08-30: qty 2

## 2022-08-30 NOTE — Discharge Instructions (Addendum)
Please follow-up with your primary care doctor, you were diagnosed with the flu, you need to drink lots of fluids, and rest.  Make sure you are wearing a mask around others and, do not return to work until your fever has broke.  For the chest wall pain, and flank pain please use lidocaine patches or Biofreeze to help with the discomfort.  You can also take Tylenol or ibuprofen for this.  Use the albuterol inhaler as needed for shortness of breath, and you can also use Mucinex to help with the cough.  If you develop severe shortness of breath, O2 sats less than 90%, or severe chest pain please return to the ER

## 2022-08-30 NOTE — ED Provider Notes (Signed)
[facility] EMERGENCY DEPARTMENT Provider Note   CSN: [id number] Arrival date & time: 08/30/22  9983     History  Chief Complaint  Patient presents with   Cough    Kristine Bell is a 51 y.o. female, history of diabetes, who presents to the ED secondary to shortness of breath for the last 3 weeks, and then development of severe fatigue, and fever last night.  She states her fever was over 100, and she just feels very poorly.  She denies any productive cough, but states it is dry, and hurts her rib cage, and sides.  She states she has been coughing quite a bit.  Does smoke, however she states she does not smoke many cigarettes a day.  Denies any nausea, vomiting, diarrhea.       Home Medications Prior to Admission medications   Medication Sig Start Date End Date Taking? Authorizing Provider  albuterol (VENTOLIN HFA) 108 (90 Base) MCG/ACT inhaler Inhale 1-2 puffs into the lungs every 6 (six) hours as needed for wheezing or shortness of breath. 08/30/22  Yes Dempsy Damiano L, PA  lidocaine (HM LIDOCAINE PATCH) 4 % Place 2 patches onto the skin daily. 08/30/22  Yes Thurley Francesconi L, PA  oseltamivir (TAMIFLU) 75 MG capsule Take 1 capsule (75 mg total) by mouth every 12 (twelve) hours. 08/30/22  Yes Taylen Wendland L, PA  aspirin 81 MG chewable tablet Chew by mouth.    [provider]  atorvastatin (LIPITOR) 10 MG tablet Take 1 tablet (10 mg total) by mouth daily. 11/03/20   Saguier, Percell Miller, PA-C  buPROPion (WELLBUTRIN XL) 150 MG 24 hr tablet 1 tab po q day 11/03/20   Saguier, Percell Miller, PA-C  chlorthalidone (HYGROTON) 25 MG tablet Take 1 tablet (25 mg total) by mouth daily. 11/03/20   Saguier, Percell Miller, PA-C  cyclobenzaprine (FLEXERIL) 10 MG tablet Take 1 tablet (10 mg total) by mouth 2 (two) times daily as needed for muscle spasms. 06/19/22   Carlisle Cater, PA-C  losartan (COZAAR) 50 MG tablet Take 1 tablet (50 mg total) by mouth daily. 11/03/20   Saguier, Percell Miller, PA-C  meloxicam  (MOBIC) 7.5 MG tablet Take 1 tablet (7.5 mg total) by mouth daily. 06/19/22   Carlisle Cater, PA-C  metFORMIN (GLUCOPHAGE) 500 MG tablet Take 1 tablet (500 mg total) by mouth 2 (two) times daily with a meal. 11/03/20   Saguier, Percell Miller, PA-C  Multiple Vitamin (MULTI-VITAMIN) tablet Take 1 tablet by mouth daily.    [provider]  omeprazole (PRILOSEC) 20 MG capsule Take 1 capsule (20 mg total) by mouth daily. 11/03/20   Saguier, Percell Miller, PA-C  oxybutynin (DITROPAN) 5 MG tablet Take 5 mg by mouth 2 (two) times daily. 09/01/20   [provider]  traZODone (DESYREL) 50 MG tablet Take 0.5-1 tablets (25-50 mg total) by mouth at bedtime as needed for sleep. 11/03/20   Saguier, Percell Miller, PA-C      Allergies    Naproxen sodium, Naproxen, Sertraline, and Tramadol    Review of Systems   Review of Systems  Respiratory:  Positive for cough.   Gastrointestinal:  Negative for diarrhea, nausea and vomiting.    Physical Exam Updated Vital Signs BP (!) 180/78   Pulse 86   Temp 98.6 F (37 C) (Oral)   Resp 17   SpO2 96%  Physical Exam Vitals and nursing note reviewed.  Constitutional:      General: She is not in acute distress.    Appearance: She is well-developed.  HENT:     Head: Normocephalic and atraumatic.  Eyes:     Conjunctiva/sclera: Conjunctivae normal.  Cardiovascular:     Rate and Rhythm: Normal rate and regular rhythm.     Heart sounds: No murmur heard. Pulmonary:     Effort: Pulmonary effort is normal. No respiratory distress.     Comments: +mild coarse lungs sounds to RUL Abdominal:     Palpations: Abdomen is soft.     Tenderness: There is no abdominal tenderness.  Musculoskeletal:        General: No swelling.     Cervical back: Neck supple.     Comments: TTP to bilateral flank and chest wall  Skin:    General: Skin is warm and dry.     Capillary Refill: Capillary refill takes less than 2 seconds.  Neurological:     Mental Status: She is alert.  Psychiatric:         Mood and Affect: Mood normal.     ED Results / Procedures / Treatments   Labs (all labs ordered are listed, but only abnormal results are displayed) Labs Reviewed  RESP PANEL BY RT-PCR (RSV, FLU A&B, COVID)  RVPGX2 - Abnormal; Notable for the following components:      Result Value   Influenza A by PCR POSITIVE (*)    All other components within normal limits    EKG None  Radiology DG Chest 2 View  Result Date: 08/30/2022 CLINICAL DATA:  Short of breath, cough EXAM: CHEST - 2 VIEW COMPARISON:  09/09/2021 FINDINGS: Frontal and lateral views of the chest demonstrate a stable cardiac silhouette. No acute airspace disease, effusion, or pneumothorax. No acute bony abnormalities. IMPRESSION: 1. Stable chest, no acute process. Electronically Signed   By: Randa Ngo M.D.   On: 08/30/2022 11:36    Procedures Procedures    Medications Ordered in ED Medications  lidocaine (LIDODERM) 5 % 2 patch (2 patches Transdermal Patch Applied 08/30/22 1153)  acetaminophen (TYLENOL) tablet 650 mg (650 mg Oral Given 08/30/22 1153)    ED Course/ Medical Decision Making/ A&P                           Medical Decision Making Patient is a 51 year old female, history of diabetes, tobacco use for shortness of breath for the last 3 weeks, and then feeling fatigued and had a fever last night.  She is fairly well-appearing, lung sounds sound a bit coarse, will obtain chest x-ray, and a COVID/flu for further evaluation.  Amount and/or Complexity of Data Reviewed Labs:     Details: Positive for influenza Radiology: ordered.    Details: Chest x-ray clear. Discussion of management or test interpretation with external provider(s): Discussed with patient positive for influenza, she is in the timeframe and does qualify for Tamiflu we will send Tamiflu to the pharmacy, and she should isolate from others, until her fever breaks, I also encouraged her to wear a mask to prevent spread of the flu.   Additionally her chest x-ray was clear, no signs of pneumonia.  Hu that she may have some bronchitis, underlying, versus chronic COPD.  Albuterol sent to the pharmacy.  Discussed with patient, she voiced understanding of return precautions, and was given a work note per her request.  Risk OTC drugs. Prescription drug management.   Final Clinical Impression(s) / ED Diagnoses Final diagnoses:  Influenza  Costochondritis  Bronchitis    Rx / DC Orders ED Discharge Orders  Ordered    lidocaine (HM LIDOCAINE PATCH) 4 %  Every 24 hours        08/30/22 1155    oseltamivir (TAMIFLU) 75 MG capsule  Every 12 hours        08/30/22 1155    albuterol (VENTOLIN HFA) 108 (90 Base) MCG/ACT inhaler  Every 6 hours PRN        08/30/22 1156              Tillmon Kisling Carlean Jews, PA 08/30/22 1200    Blanchie Dessert, MD 08/30/22 1459

## 2022-08-30 NOTE — ED Triage Notes (Signed)
Patient presents to ED via POV from home. Here with cough, headache and body aches. Patient works at a school. Reports symptoms x 3 weeks.

## 2022-11-11 ENCOUNTER — Ambulatory Visit (INDEPENDENT_AMBULATORY_CARE_PROVIDER_SITE_OTHER): Payer: BC Managed Care – PPO | Admitting: Family Medicine

## 2022-11-11 ENCOUNTER — Encounter: Payer: Self-pay | Admitting: Family Medicine

## 2022-11-11 VITALS — BP 146/98 | Ht 65.0 in | Wt 340.0 lb

## 2022-11-11 DIAGNOSIS — M778 Other enthesopathies, not elsewhere classified: Secondary | ICD-10-CM

## 2022-11-11 MED ORDER — PREDNISONE 5 MG PO TABS: ORAL_TABLET | ORAL | 0 refills | Status: DC

## 2022-11-11 NOTE — Patient Instructions (Signed)
Good to see you Please try heat  Please continue stretching We'll get the MRi at [place]   Please send me a message in [place] with any questions or updates.  We'll setup a virtual visit once the MRi is resulted.   --Dr. Raeford Razor

## 2022-11-11 NOTE — Assessment & Plan Note (Signed)
Acute on chronic in nature.  The pain has been ongoing for several years.  We have tried therapy, injections and medications.  Previous imaging of her shoulder in 2020 did not identify a source.  She has completed greater than 6 weeks of physician directed home exercise therapy.  I -Counseled on home exercise therapy and supportive care. -Prednisone. -MRI of the right shoulder to evaluate for capsulitis or labral tear and for presurgical planning.

## 2022-11-11 NOTE — Progress Notes (Signed)
  Kristine Bell - 52 y.o. female MRN [id number]  Date of birth: [birth year]  SUBJECTIVE:  Including CC & ROS.  No chief complaint on file.   Kristine Bell is a 52 y.o. female that is acute on chronic right shoulder pain.  The pain has been present for several years.  She has had physical therapy, injections and medications with limited improvement.  It is experienced on a daily basis.    Review of Systems See HPI   HISTORY: Past Medical, Surgical, Social, and Family History Reviewed & Updated per EMR.   Pertinent Historical Findings include:  Past Medical History:  Diagnosis Date   Diabetes mellitus without complication (HCC)    GERD (gastroesophageal reflux disease)    Hyperlipidemia    Hypertension    Kidney stone    Kidney stones    Obesity     Past Surgical History:  Procedure Laterality Date   ABLATION ON ENDOMETRIOSIS     HERNIA REPAIR     LITHOTRIPSY     TUBAL LIGATION       PHYSICAL EXAM:  VS: BP (!) 146/98 (BP Location: Left Arm, Patient Position: Sitting)   Ht '5\' 5"'$  (1.651 m)   Wt (!) 340 lb (154.2 kg)   BMI 56.58 kg/m  Physical Exam Gen: NAD, alert, cooperative with exam, well-appearing MSK:  Right shoulder: Limited flexion and extension. Limited external rotation. Weakness on exam. Positive empty can testing. Positive speeds test. Positive O'Brien's test Neurovascularly intact       ASSESSMENT & PLAN:   Capsulitis of right shoulder Acute on chronic in nature.  The pain has been ongoing for several years.  We have tried therapy, injections and medications.  Previous imaging of her shoulder in 2020 did not identify a source.  She has completed greater than 6 weeks of physician directed home exercise therapy.  I -Counseled on home exercise therapy and supportive care. -Prednisone. -MRI of the right shoulder to evaluate for capsulitis or labral tear and for presurgical planning.

## 2022-11-28 ENCOUNTER — Other Ambulatory Visit: Payer: Self-pay

## 2022-11-28 ENCOUNTER — Emergency Department (HOSPITAL_BASED_OUTPATIENT_CLINIC_OR_DEPARTMENT_OTHER): Payer: BC Managed Care – PPO

## 2022-11-28 ENCOUNTER — Encounter (HOSPITAL_BASED_OUTPATIENT_CLINIC_OR_DEPARTMENT_OTHER): Payer: Self-pay

## 2022-11-28 ENCOUNTER — Emergency Department (HOSPITAL_BASED_OUTPATIENT_CLINIC_OR_DEPARTMENT_OTHER)
Admission: EM | Admit: 2022-11-28 | Discharge: 2022-11-28 | Disposition: A | Discharge status: Home / Self Care | Payer: BC Managed Care – PPO | Attending: Emergency Medicine | Admitting: Emergency Medicine

## 2022-11-28 DIAGNOSIS — E119 Type 2 diabetes mellitus without complications: Secondary | ICD-10-CM | POA: Diagnosis not present

## 2022-11-28 DIAGNOSIS — Z7982 Long term (current) use of aspirin: Secondary | ICD-10-CM | POA: Insufficient documentation

## 2022-11-28 DIAGNOSIS — M546 Pain in thoracic spine: Secondary | ICD-10-CM | POA: Insufficient documentation

## 2022-11-28 DIAGNOSIS — R079 Chest pain, unspecified: Secondary | ICD-10-CM | POA: Diagnosis not present

## 2022-11-28 DIAGNOSIS — I10 Essential (primary) hypertension: Secondary | ICD-10-CM | POA: Insufficient documentation

## 2022-11-28 DIAGNOSIS — Z79899 Other long term (current) drug therapy: Secondary | ICD-10-CM | POA: Diagnosis not present

## 2022-11-28 DIAGNOSIS — Z7984 Long term (current) use of oral hypoglycemic drugs: Secondary | ICD-10-CM | POA: Diagnosis not present

## 2022-11-28 LAB — CBC
HCT: 42.6 % (ref 36.0–46.0)
Hemoglobin: 13.5 g/dL (ref 12.0–15.0)
MCH: 26.3 pg (ref 26.0–34.0)
MCHC: 31.7 g/dL (ref 30.0–36.0)
MCV: 83 fL (ref 80.0–100.0)
Platelets: 302 10*3/uL (ref 150–400)
RBC: 5.13 MIL/uL — ABNORMAL HIGH (ref 3.87–5.11)
RDW: 15.4 % (ref 11.5–15.5)
WBC: 9.8 10*3/uL (ref 4.0–10.5)
nRBC: 0 % (ref 0.0–0.2)

## 2022-11-28 LAB — BASIC METABOLIC PANEL
Anion gap: 5 (ref 5–15)
BUN: 13 mg/dL (ref 6–20)
CO2: 24 mmol/L (ref 22–32)
Calcium: 9.4 mg/dL (ref 8.9–10.3)
Chloride: 104 mmol/L (ref 98–111)
Creatinine, Ser: 0.68 mg/dL (ref 0.44–1.00)
GFR, Estimated: >60 mL/min (ref >60)
Glucose, Bld: 142 mg/dL — ABNORMAL HIGH (ref 70–99)
Potassium: 4 mmol/L (ref 3.5–5.1)
Sodium: 133 mmol/L — ABNORMAL LOW (ref 135–145)

## 2022-11-28 LAB — TROPONIN I (HIGH SENSITIVITY)
Troponin I (High Sensitivity): 5 ng/L (ref <18)
Troponin I (High Sensitivity): 5 ng/L (ref <18)

## 2022-11-28 MED ORDER — CYCLOBENZAPRINE HCL 10 MG PO TABS
10.0000 mg | ORAL_TABLET | Freq: Once | ORAL | Status: AC
  Administered 2022-11-28: 10 mg via ORAL
  Filled 2022-11-28: qty 1

## 2022-11-28 MED ORDER — OXYCODONE HCL 5 MG PO TABS
5.0000 mg | ORAL_TABLET | Freq: Once | ORAL | Status: AC
  Administered 2022-11-28: 5 mg via ORAL
  Filled 2022-11-28: qty 1

## 2022-11-28 MED ORDER — OXYCODONE-ACETAMINOPHEN 5-325 MG PO TABS: 1.0000 | ORAL_TABLET | Freq: Once | ORAL | Status: DC

## 2022-11-28 MED ORDER — CYCLOBENZAPRINE HCL 10 MG PO TABS: 10.0000 mg | ORAL_TABLET | Freq: Two times a day (BID) | ORAL | 0 refills | Status: DC | PRN

## 2022-11-28 MED ORDER — OXYCODONE-ACETAMINOPHEN 5-325 MG PO TABS: 1.0000 | ORAL_TABLET | Freq: Four times a day (QID) | ORAL | 0 refills | Status: DC | PRN

## 2022-11-28 MED ORDER — ACETAMINOPHEN 325 MG PO TABS
650.0000 mg | ORAL_TABLET | Freq: Once | ORAL | Status: AC
  Administered 2022-11-28: 650 mg via ORAL
  Filled 2022-11-28: qty 2

## 2022-11-28 NOTE — ED Triage Notes (Signed)
Pt reports upper mid back pain that radiates to the left arm X2 days. Pt also reports nonproductive cough with chest soreness when she coughs. Denies SOB.

## 2022-11-28 NOTE — ED Provider Notes (Signed)
[facility] Provider Note   CSN: [id number] Arrival date & time: 11/28/22  [id number]     History  Chief Complaint  Patient presents with   Back Pain    Kristine Bell is a 52 y.o. female history of diabetes, GERD, hyperlipidemia, hypertension, kidney stone presents today for evaluation of back pain and chest pain.  Patient reports she has had back pain in the last 7 days.  Pain is intermittent, located in the upper back, radiating to her left arm..  Pain is worse with laying down.  States she also started to have chest pain yesterday.  Pain is in the central chest, intermittent, sharp, non-radiating, worse with coughing.  She denies any recent trauma or surgery, no personal or family history of DVT/PE or cancer.  Denies any fever, headache, dizziness, bowel change, urinary symptoms, extremity weakness or numbness.   Chest Pain Associated symptoms: back pain   Back Pain Associated symptoms: chest pain     Past Medical History:  Diagnosis Date   Diabetes mellitus without complication (HCC)    GERD (gastroesophageal reflux disease)    Hyperlipidemia    Hypertension    Kidney stone    Kidney stones    Obesity    Past Surgical History:  Procedure Laterality Date   ABLATION ON ENDOMETRIOSIS     HERNIA REPAIR     LITHOTRIPSY     TUBAL LIGATION       Home Medications Prior to Admission medications   Medication Sig Start Date End Date Taking? Authorizing Provider  cyclobenzaprine (FLEXERIL) 10 MG tablet Take 1 tablet (10 mg total) by mouth 2 (two) times daily as needed for muscle spasms. 11/28/22  Yes Rex Kras, PA  oxyCODONE-acetaminophen (PERCOCET/ROXICET) 5-325 MG tablet Take 1 tablet by mouth every 6 (six) hours as needed for up to 10 doses for severe pain. 11/28/22  Yes Rex Kras, PA  albuterol (VENTOLIN HFA) 108 (90 Base) MCG/ACT inhaler Inhale 1-2 puffs into the lungs every 6 (six) hours as needed for wheezing or shortness of breath.  08/30/22   Small, Brooke L, PA  aspirin 81 MG chewable tablet Chew by mouth.    [provider]  atorvastatin (LIPITOR) 10 MG tablet Take 1 tablet (10 mg total) by mouth daily. 11/03/20   Saguier, Percell Miller, PA-C  buPROPion (WELLBUTRIN XL) 150 MG 24 hr tablet 1 tab po q day 11/03/20   Saguier, Percell Miller, PA-C  chlorthalidone (HYGROTON) 25 MG tablet Take 1 tablet (25 mg total) by mouth daily. 11/03/20   Saguier, Percell Miller, PA-C  cyclobenzaprine (FLEXERIL) 10 MG tablet Take 1 tablet (10 mg total) by mouth 2 (two) times daily as needed for muscle spasms. 06/19/22   Carlisle Cater, PA-C  lidocaine (HM LIDOCAINE PATCH) 4 % Place 2 patches onto the skin daily. 08/30/22   Small, Brooke L, PA  losartan (COZAAR) 50 MG tablet Take 1 tablet (50 mg total) by mouth daily. 11/03/20   Saguier, Percell Miller, PA-C  meloxicam (MOBIC) 7.5 MG tablet Take 1 tablet (7.5 mg total) by mouth daily. 06/19/22   Carlisle Cater, PA-C  metFORMIN (GLUCOPHAGE) 500 MG tablet Take 1 tablet (500 mg total) by mouth 2 (two) times daily with a meal. 11/03/20   Saguier, Percell Miller, PA-C  Multiple Vitamin (MULTI-VITAMIN) tablet Take 1 tablet by mouth daily.    [provider]  omeprazole (PRILOSEC) 20 MG capsule Take 1 capsule (20 mg total) by mouth daily. 11/03/20   Saguier, Percell Miller, PA-C  oseltamivir (TAMIFLU) 75 MG capsule Take 1 capsule (75 mg total) by mouth every 12 (twelve) hours. 08/30/22   Small, Brooke L, PA  oxybutynin (DITROPAN) 5 MG tablet Take 5 mg by mouth 2 (two) times daily. 09/01/20   [provider]  predniSONE (DELTASONE) 5 MG tablet Take 6 pills for first day, 5 pills second day, 4 pills third day, 3 pills fourth day, 2 pills the fifth day, and 1 pill sixth day. 11/11/22   Rosemarie Ax, MD  traZODone (DESYREL) 50 MG tablet Take 0.5-1 tablets (25-50 mg total) by mouth at bedtime as needed for sleep. 11/03/20   Saguier, Percell Miller, PA-C      Allergies    Naproxen sodium, Naproxen, Sertraline, and Tramadol    Review of  Systems   Review of Systems  Cardiovascular:  Positive for chest pain.  Musculoskeletal:  Positive for back pain.    Physical Exam Updated Vital Signs BP (!) 158/91   Pulse 60   Temp 97.6 F (36.4 C)   Resp (!) 21   Ht 5\' 5"  (1.651 m)   Wt (!) 147.4 kg   SpO2 97%   BMI 54.08 kg/m  Physical Exam Vitals and nursing note reviewed.  Constitutional:      Appearance: Normal appearance.  HENT:     Head: Normocephalic and atraumatic.     Mouth/Throat:     Mouth: Mucous membranes are moist.  Eyes:     General: No scleral icterus. Cardiovascular:     Rate and Rhythm: Normal rate and regular rhythm.     Pulses: Normal pulses.     Heart sounds: Normal heart sounds.  Pulmonary:     Effort: Pulmonary effort is normal.     Breath sounds: Normal breath sounds.  Abdominal:     General: Abdomen is flat.     Palpations: Abdomen is soft.     Tenderness: There is no abdominal tenderness.  Musculoskeletal:        General: No deformity.     Comments: Tenderness to palpation to paraspinal upper back.  Skin:    General: Skin is warm.     Findings: No rash.  Neurological:     General: No focal deficit present.     Mental Status: She is alert.  Psychiatric:        Mood and Affect: Mood normal.     ED Results / Procedures / Treatments   Labs (all labs ordered are listed, but only abnormal results are displayed) Labs Reviewed  BASIC METABOLIC PANEL - Abnormal; Notable for the following components:      Result Value   Sodium 133 (*)    Glucose, Bld 142 (*)    All other components within normal limits  CBC - Abnormal; Notable for the following components:   RBC 5.13 (*)    All other components within normal limits  TROPONIN I (HIGH SENSITIVITY)  TROPONIN I (HIGH SENSITIVITY)    EKG EKG Interpretation  Date/Time:  Monday November 28 2022 09:48:53 EDT Ventricular Rate:  72 PR Interval:  170 QRS Duration: 95 QT Interval:  407 QTC Calculation: 446 R Axis:   77 Text  Interpretation: Sinus rhythm Confirmed by Octaviano Glow [phone]) on 11/28/2022 9:49:51 AM  Radiology DG Chest 2 View  Result Date: 11/28/2022 CLINICAL DATA:  Chest pain, shortness of breath EXAM: CHEST - 2 VIEW COMPARISON:  08/30/2022 FINDINGS: Transverse diameter of heart is increased. There are no signs of pulmonary edema or focal pulmonary consolidation. There  is no pleural effusion or pneumothorax. IMPRESSION: Cardiomegaly. There are no signs of pulmonary edema or focal pulmonary consolidation. Electronically Signed   By: Elmer Picker M.D.   On: 11/28/2022 10:12    Procedures Procedures    Medications Ordered in ED Medications  acetaminophen (TYLENOL) tablet 650 mg (650 mg Oral Given 11/28/22 1014)  cyclobenzaprine (FLEXERIL) tablet 10 mg (10 mg Oral Given 11/28/22 1021)  oxyCODONE (Oxy IR/ROXICODONE) immediate release tablet 5 mg (5 mg Oral Given 11/28/22 1147)    ED Course/ Medical Decision Making/ A&P Clinical Course as of 11/28/22 1323  Mon Nov 28, 2022  1017 This is a 52 year old female with a history of obesity presenting to the ED with left mid thoracic back pain.  She reports onset about 2 days ago.  Pain is worse with movement, reaching across her body with her arm, deep inspiration.  She reports she has had a mild cough, no fevers or chills.  Denies diaphoresis.  Denies history of MI or coronary disease, denies history of DVT or PE.  On exam she has focally reproducible tenderness below the left scapula, trigger point tenderness of these muscles.  No falls or traumatic injury or spinal midline tenderness to suspect a spinal fracture.  Patient is EKG per my interpretation patient does not show any acute ischemic findings.  I reviewed her x-rays and do not see evidence of focal infiltrate or pneumothorax.  She is pending lab work, as well as Tylenol and a Theatre stage manager.  If there are no acute findings on her test I suspect this most likely musculoskeletal pain, [MT]     Clinical Course User Index [MT] Trifan, Carola Rhine, MD                             Medical Decision Making Amount and/or Complexity of Data Reviewed Labs: ordered. Radiology: ordered.  Risk OTC drugs. Prescription drug management.   This patient presents to the ED for back pain and chest pain, this involves an extensive number of treatment options, and is a complaint that carries with a high risk of complications and morbidity.  The differential diagnosis includes trauma, musculoskeletal pain, mi/acs, pneumothorax, PE.  This is not an exhaustive list.  Lab tests: I ordered and personally interpreted labs.  The pertinent results include: WBC unremarkable. Hbg unremarkable. Platelets unremarkable. Electrolytes unremarkable. BUN, creatinine unremarkable.  Delta Trop negative.  Imaging studies: I ordered imaging studies. I personally reviewed, interpreted imaging and agree with the radiologist's interpretations. The results include: Chest x-ray negative.  Problem list/ ED course/ Critical interventions/ Medical management: HPI: See above Vital signs within normal range and stable throughout visit. Laboratory/imaging studies significant for: See above. On physical examination, patient is afebrile and appears in no acute distress. Exam without evidence of volume overload so doubt heart failure. EKG without signs of active ischemia. Given the timing of pain to ER presentation, delta troponin was negative so doubt NSTEMI. Presentation not consistent with acute PE (Wells low risk),pneumothorax (not visualized on chest xr), thoracic aortic dissection, pericarditis, tamponade, pneumonia (no infectious symptoms, clear chest xr), myocarditis (no recent illness, neg trop). HEART score:1 so plan to discharge patient home with PMD follow up. This patient also presents with back pain most consistent with musculoskeletal spasm/strain. No back pain red flags on history or physical. Presentation not  consistent with malignancy (lack of history of malignancy, lack of B symptoms), fracture (no trauma, no bony tenderness to  palpation), transverse myelitis, (no sensory loss, no distal weakness), thoracic aortic dissection (equal peripheral pulses, no tachycardia, story does not fit), pneumonia (afebrile, no infectious symptoms), pulmonary embolism (Well's low risk), osteomyelitis or epidural abscess (no IVDU, vertebral tenderness). Given the clinical picture, no indication for imaging at this time.  Pain is controlled with Flexeril and oxycodone here in the emergency department.  Plan to discharge with PCP follow up and strict return precaution. I have reviewed the patient home medicines and have made adjustments as needed.  Cardiac monitoring/EKG: The patient was maintained on a cardiac monitor.  I personally reviewed and interpreted the cardiac monitor which showed an underlying rhythm of: sinus rhythm.  Additional history obtained: External records from outside source obtained and reviewed including: Chart review including previous notes, labs, imaging.  Consultations obtained:  Disposition Continued outpatient therapy. Follow-up with PCP recommended for reevaluation of symptoms. Treatment plan discussed with patient.  Pt acknowledged understanding was agreeable to the plan. Worrisome signs and symptoms were discussed with patient, and patient acknowledged understanding to return to the ED if they noticed these signs and symptoms. Patient was stable upon discharge.   This chart was dictated using voice recognition software.  Despite best efforts to proofread,  errors can occur which can change the documentation meaning.          Final Clinical Impression(s) / ED Diagnoses Final diagnoses:  Acute bilateral thoracic back pain  Chest pain, unspecified type    Rx / DC Orders ED Discharge Orders          Ordered    cyclobenzaprine (FLEXERIL) 10 MG tablet  2 times daily PRN         11/28/22 1313    oxyCODONE-acetaminophen (PERCOCET/ROXICET) 5-325 MG tablet  Every 6 hours PRN        11/28/22 1313              Rex Kras, [place] 11/28/22 1323    Wyvonnia Dusky, MD 11/28/22 1329

## 2022-11-28 NOTE — ED Notes (Signed)
Discharge instructions reviewed with patient. Patient verbalizes understanding, no further questions at this time. Medications/prescriptions and follow up information provided. No acute distress noted at time of departure.  

## 2022-11-28 NOTE — Discharge Instructions (Signed)
Please take your medications as prescribed. Take tylenol/ibuprofen, flexeril or Percocet as needed for pain. I recommend close follow-up with PCP for reevaluation.  Please do not hesitate to return to emergency department if worrisome signs symptoms we discussed become apparent.

## 2022-11-30 ENCOUNTER — Encounter: Payer: Self-pay | Admitting: Family Medicine

## 2022-12-09 ENCOUNTER — Other Ambulatory Visit: Payer: Medicaid Other

## 2022-12-26 ENCOUNTER — Encounter: Payer: Self-pay | Admitting: *Deleted

## 2023-03-06 ENCOUNTER — Emergency Department (HOSPITAL_BASED_OUTPATIENT_CLINIC_OR_DEPARTMENT_OTHER)
Admission: EM | Admit: 2023-03-06 | Discharge: 2023-03-06 | Disposition: A | Discharge status: Home / Self Care | Payer: BC Managed Care – PPO | Attending: Emergency Medicine | Admitting: Emergency Medicine

## 2023-03-06 ENCOUNTER — Emergency Department (HOSPITAL_BASED_OUTPATIENT_CLINIC_OR_DEPARTMENT_OTHER): Payer: BC Managed Care – PPO

## 2023-03-06 ENCOUNTER — Other Ambulatory Visit: Payer: Self-pay

## 2023-03-06 ENCOUNTER — Encounter (HOSPITAL_BASED_OUTPATIENT_CLINIC_OR_DEPARTMENT_OTHER): Payer: Self-pay | Admitting: Emergency Medicine

## 2023-03-06 DIAGNOSIS — E119 Type 2 diabetes mellitus without complications: Secondary | ICD-10-CM | POA: Diagnosis not present

## 2023-03-06 DIAGNOSIS — E669 Obesity, unspecified: Secondary | ICD-10-CM | POA: Insufficient documentation

## 2023-03-06 DIAGNOSIS — Z79899 Other long term (current) drug therapy: Secondary | ICD-10-CM | POA: Insufficient documentation

## 2023-03-06 DIAGNOSIS — M25522 Pain in left elbow: Secondary | ICD-10-CM | POA: Diagnosis present

## 2023-03-06 DIAGNOSIS — Z6841 Body Mass Index (BMI) 40.0 and over, adult: Secondary | ICD-10-CM | POA: Diagnosis not present

## 2023-03-06 DIAGNOSIS — Z7984 Long term (current) use of oral hypoglycemic drugs: Secondary | ICD-10-CM | POA: Diagnosis not present

## 2023-03-06 DIAGNOSIS — S39012A Strain of muscle, fascia and tendon of lower back, initial encounter: Secondary | ICD-10-CM

## 2023-03-06 DIAGNOSIS — Z7982 Long term (current) use of aspirin: Secondary | ICD-10-CM | POA: Diagnosis not present

## 2023-03-06 DIAGNOSIS — M62830 Muscle spasm of back: Secondary | ICD-10-CM | POA: Diagnosis not present

## 2023-03-06 DIAGNOSIS — W010XXA Fall on same level from slipping, tripping and stumbling without subsequent striking against object, initial encounter: Secondary | ICD-10-CM | POA: Diagnosis not present

## 2023-03-06 DIAGNOSIS — I10 Essential (primary) hypertension: Secondary | ICD-10-CM | POA: Insufficient documentation

## 2023-03-06 DIAGNOSIS — S5002XA Contusion of left elbow, initial encounter: Secondary | ICD-10-CM

## 2023-03-06 MED ORDER — MORPHINE SULFATE (PF) 4 MG/ML IV SOLN
4.0000 mg | Freq: Once | INTRAVENOUS | Status: AC
  Administered 2023-03-06: 4 mg via INTRAMUSCULAR
  Filled 2023-03-06: qty 1

## 2023-03-06 MED ORDER — HYDROCODONE-ACETAMINOPHEN 5-325 MG PO TABS: 1.0000 | ORAL_TABLET | ORAL | 0 refills | Status: DC | PRN

## 2023-03-06 MED ORDER — ONDANSETRON 4 MG PO TBDP
4.0000 mg | ORAL_TABLET | Freq: Once | ORAL | Status: AC
  Administered 2023-03-06: 4 mg via ORAL
  Filled 2023-03-06: qty 1

## 2023-03-06 MED ORDER — METHOCARBAMOL 500 MG PO TABS: 500.0000 mg | ORAL_TABLET | Freq: Two times a day (BID) | ORAL | 0 refills | Status: DC | PRN

## 2023-03-06 MED ORDER — METHOCARBAMOL 500 MG PO TABS
500.0000 mg | ORAL_TABLET | Freq: Once | ORAL | Status: AC
  Administered 2023-03-06: 500 mg via ORAL
  Filled 2023-03-06: qty 1

## 2023-03-06 NOTE — ED Triage Notes (Signed)
Fell on wet floor and her feet went out from under her  . Larey Seat and landed on left side  c/o back  pain left arm  under side  can wiggle fingers , feel me touch and good pulse

## 2023-03-06 NOTE — ED Provider Notes (Signed)
[facility] Provider Note   CSN: [id number] Arrival date & time: 03/06/23  1012     History  Chief Complaint  Patient presents with   Back Pain    Kristine Bell is a 52 y.o. female.  Pt is a 52 yo female with pmhx significant for dm, htn, kidney stones, hld, gerd, and obesity.  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  She fell and landed on her left side.  She has pain to her back and to her elbow.  She did not hit her head or have a loc.  She is ambulatory.  No blood thinners.       Home Medications Prior to Admission medications   Medication Sig Start Date End Date Taking? Authorizing Provider  HYDROcodone-acetaminophen (NORCO/VICODIN) 5-325 MG tablet Take 1 tablet by mouth every 4 (four) hours as needed. 03/06/23  Yes Jacalyn Lefevre, MD  methocarbamol (ROBAXIN) 500 MG tablet Take 1 tablet (500 mg total) by mouth 2 (two) times daily as needed for muscle spasms. 03/06/23  Yes Jacalyn Lefevre, MD  albuterol (VENTOLIN HFA) 108 (90 Base) MCG/ACT inhaler Inhale 1-2 puffs into the lungs every 6 (six) hours as needed for wheezing or shortness of breath. 08/30/22   Small, Brooke L, PA  aspirin 81 MG chewable tablet Chew by mouth.    [provider]  atorvastatin (LIPITOR) 10 MG tablet Take 1 tablet (10 mg total) by mouth daily. 11/03/20   Saguier, Ramon Dredge, PA-C  buPROPion (WELLBUTRIN XL) 150 MG 24 hr tablet 1 tab po q day 11/03/20   Saguier, Ramon Dredge, PA-C  chlorthalidone (HYGROTON) 25 MG tablet Take 1 tablet (25 mg total) by mouth daily. 11/03/20   Saguier, Ramon Dredge, PA-C  cyclobenzaprine (FLEXERIL) 10 MG tablet Take 1 tablet (10 mg total) by mouth 2 (two) times daily as needed for muscle spasms. 06/19/22   Renne Crigler, PA-C  cyclobenzaprine (FLEXERIL) 10 MG tablet Take 1 tablet (10 mg total) by mouth 2 (two) times daily as needed for muscle spasms. 11/28/22   Jeanelle Malling, PA  lidocaine (HM LIDOCAINE PATCH) 4 % Place 2 patches onto  the skin daily. 08/30/22   Small, Brooke L, PA  losartan (COZAAR) 50 MG tablet Take 1 tablet (50 mg total) by mouth daily. 11/03/20   Saguier, Ramon Dredge, PA-C  meloxicam (MOBIC) 7.5 MG tablet Take 1 tablet (7.5 mg total) by mouth daily. 06/19/22   Renne Crigler, PA-C  metFORMIN (GLUCOPHAGE) 500 MG tablet Take 1 tablet (500 mg total) by mouth 2 (two) times daily with a meal. 11/03/20   Saguier, Ramon Dredge, PA-C  Multiple Vitamin (MULTI-VITAMIN) tablet Take 1 tablet by mouth daily.    [provider]  omeprazole (PRILOSEC) 20 MG capsule Take 1 capsule (20 mg total) by mouth daily. 11/03/20   Saguier, Ramon Dredge, PA-C  oseltamivir (TAMIFLU) 75 MG capsule Take 1 capsule (75 mg total) by mouth every 12 (twelve) hours. 08/30/22   Small, Brooke L, PA  oxybutynin (DITROPAN) 5 MG tablet Take 5 mg by mouth 2 (two) times daily. 09/01/20   [provider]  oxyCODONE-acetaminophen (PERCOCET/ROXICET) 5-325 MG tablet Take 1 tablet by mouth every 6 (six) hours as needed for up to 10 doses for severe pain. 11/28/22   Jeanelle Malling, PA  predniSONE (DELTASONE) 5 MG tablet Take 6 pills for first day, 5 pills second day, 4 pills third day, 3 pills fourth day, 2 pills the fifth day, and 1 pill sixth  day. 11/11/22   Myra Rude, MD  traZODone (DESYREL) 50 MG tablet Take 0.5-1 tablets (25-50 mg total) by mouth at bedtime as needed for sleep. 11/03/20   Saguier, Ramon Dredge, PA-C      Allergies    Naproxen sodium, Naproxen, Sertraline, and Tramadol    Review of Systems   Review of Systems  Musculoskeletal:  Positive for back pain.       Left elbow pain  All other systems reviewed and are negative.   Physical Exam Updated Vital Signs BP (!) 185/88   Pulse 71   Temp 98.4 F (36.9 C)   Resp 19   Ht 5\' 5"  (1.651 m)   Wt (!) 154.2 kg   SpO2 98%   BMI 56.58 kg/m  Physical Exam Vitals and nursing note reviewed.  Constitutional:      Appearance: Normal appearance. She is obese.  HENT:     Head: Normocephalic and  atraumatic.     Right Ear: External ear normal.     Left Ear: External ear normal.     Nose: Nose normal.     Mouth/Throat:     Mouth: Mucous membranes are moist.     Pharynx: Oropharynx is clear.  Eyes:     Extraocular Movements: Extraocular movements intact.     Conjunctiva/sclera: Conjunctivae normal.     Pupils: Pupils are equal, round, and reactive to light.  Cardiovascular:     Rate and Rhythm: Normal rate and regular rhythm.     Pulses: Normal pulses.     Heart sounds: Normal heart sounds.  Pulmonary:     Effort: Pulmonary effort is normal.     Breath sounds: Normal breath sounds.  Abdominal:     General: Abdomen is flat. Bowel sounds are normal.     Palpations: Abdomen is soft.  Musculoskeletal:     Cervical back: Normal range of motion and neck supple.       Back:     Comments: Left elbow and upper arm with bruising, but good rom.  Skin:    General: Skin is warm.     Capillary Refill: Capillary refill takes less than 2 seconds.  Neurological:     General: No focal deficit present.     Mental Status: She is alert and oriented to person, place, and time.  Psychiatric:        Mood and Affect: Mood normal.        Behavior: Behavior normal.     ED Results / Procedures / Treatments   Labs (all labs ordered are listed, but only abnormal results are displayed) Labs Reviewed - No data to display  EKG None  Radiology DG Lumbar Spine Complete  Result Date: 03/06/2023 CLINICAL DATA:  Trauma, fall EXAM: LUMBAR SPINE - COMPLETE 4+ VIEW COMPARISON:  11/27/2019 FINDINGS: No recent fracture is seen. Alignment of posterior margins of vertebral bodies appears normal. Degenerative changes are noted with anterior bony spurs and facet hypertrophy, more so at L5-S1 level with interval progression. IMPRESSION: No recent fracture is seen in lumbar spine. Degenerative changes are noted with bony spurs and facet hypertrophy, more so at L5-S1 level. Electronically Signed   By: Ernie Avena M.D.   On: 03/06/2023 11:40   DG Thoracic Spine 2 View  Result Date: 03/06/2023 CLINICAL DATA:  Trauma, fall EXAM: THORACIC SPINE 2 VIEWS COMPARISON:  None Available. FINDINGS: No recent fracture is seen. Alignment of posterior margins of vertebral bodies is within normal limits. Degenerative changes are  noted with bony spurs, more so in the lower thoracic spine. Paraspinal soft tissues are unremarkable. IMPRESSION: No recent fracture is seen in thoracic spine. Electronically Signed   By: Ernie Avena M.D.   On: 03/06/2023 11:39   DG Elbow Complete Left  Result Date: 03/06/2023 CLINICAL DATA:  Trauma, fall EXAM: LEFT ELBOW - COMPLETE 3+ VIEW COMPARISON:  None Available. FINDINGS: No recent fracture or dislocation is seen. There is no displacement of fat pads. There is 6 mm smoothly marginated calcification at the tip of the uncinate process, possibly residual change from previous injury. In the AP view, there is 2 mm calcific density in the soft tissues medial to the elbow, possibly residual from previous soft tissue injury. IMPRESSION: No recent fracture or dislocation is seen in left elbow. There is no significant effusion. There are few smooth marginated calcifications adjacent to the coronoid process of proximal ulna and in the soft tissues along the medial aspect of the elbow which may be residual from previous injury. Electronically Signed   By: Ernie Avena M.D.   On: 03/06/2023 11:35    Procedures Procedures    Medications Ordered in ED Medications  methocarbamol (ROBAXIN) tablet 500 mg (has no administration in time range)  morphine (PF) 4 MG/ML injection 4 mg (4 mg Intramuscular Given 03/06/23 1105)  ondansetron (ZOFRAN-ODT) disintegrating tablet 4 mg (4 mg Oral Given 03/06/23 1106)    ED Course/ Medical Decision Making/ A&P                             Medical Decision Making Amount and/or Complexity of Data Reviewed Radiology:  ordered.  Risk Prescription drug management.   This patient presents to the ED for concern of fall, this involves an extensive number of treatment options, and is a complaint that carries with it a high risk of complications and morbidity.  The differential diagnosis includes multiple trauma   Co morbidities that complicate the patient evaluation  dm, htn, kidney stones, hld, gerd, and obesity   Additional history obtained:  Additional history obtained from epic chart review  Imaging Studies ordered:  I ordered imaging studies including left elbow, thoracic, lumbar spine  I independently visualized and interpreted imaging which showed  L elbow: No recent fracture or dislocation is seen in left elbow. There is no  significant effusion.    There are few smooth marginated calcifications adjacent to the  coronoid process of proximal ulna and in the soft tissues along the  medial aspect of the elbow which may be residual from previous  injury.  Lumbar: No recent fracture is seen in lumbar spine. Degenerative changes are  noted with bony spurs and facet hypertrophy, more so at L5-S1 level.  Thoracic: No recent fracture is seen in thoracic spine.  I agree with the radiologist interpretation   Cardiac Monitoring:  The patient was maintained on a cardiac monitor.  I personally viewed and interpreted the cardiac monitored which showed an underlying rhythm of: nsr   Medicines ordered and prescription drug management:  I ordered medication including morphine  for pain  Reevaluation of the patient after these medicines showed that the patient improved I have reviewed the patients home medicines and have made adjustments as needed   Test Considered:  xr   Critical Interventions:  Pain control   Problem List / ED Course:  Fall:  left elbow contusion and lumbar strain.  No new fx.  Pt is  stable for d/c.  Return if worse.  F/u with pcp.   Reevaluation:  After the  interventions noted above, I reevaluated the patient and found that they have :improved   Social Determinants of Health:  Lives at home   Dispostion:  After consideration of the diagnostic results and the patients response to treatment, I feel that the patent would benefit from discharge with outpatient f/u.          Final Clinical Impression(s) / ED Diagnoses Final diagnoses:  Contusion of left elbow, initial encounter  Strain of lumbar region, initial encounter  Muscle spasm of back    Rx / DC Orders ED Discharge Orders          Ordered    methocarbamol (ROBAXIN) 500 MG tablet  2 times daily PRN        03/06/23 1148    HYDROcodone-acetaminophen (NORCO/VICODIN) 5-325 MG tablet  Every 4 hours PRN        03/06/23 1148              Jacalyn Lefevre, MD 03/06/23 1150

## 2023-03-09 IMAGING — CT CT RENAL STONE PROTOCOL
2 of 4 series · 16 of 46 positions shown, 18 images · non-contrast
Comparison: Abdominopelvic CT 08/24/2020.

CLINICAL DATA: Three days of left flank pain with headaches and
dizziness. Kidney stone suspected.

EXAM:
CT ABDOMEN AND PELVIS WITHOUT CONTRAST
TECHNIQUE: Multidetector CT imaging of the abdomen and pelvis was performed
following the standard protocol without IV contrast.

[Series 2: axial st · axial · 0.98mm/px · z∈[-530,-70]mm · 13 of 102 slices shown, 15 images]
[im 5/102  soft-tissue]
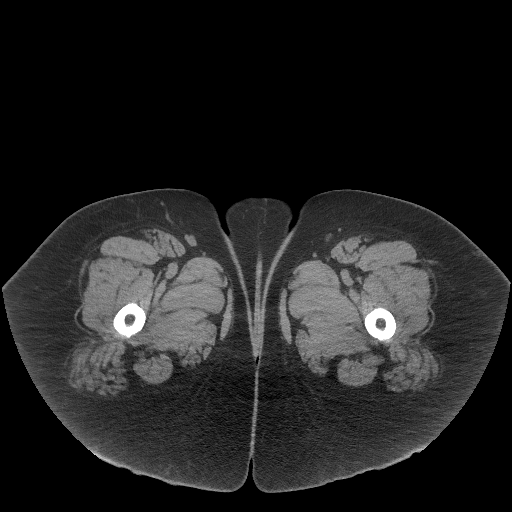
[im 5/102  bone]
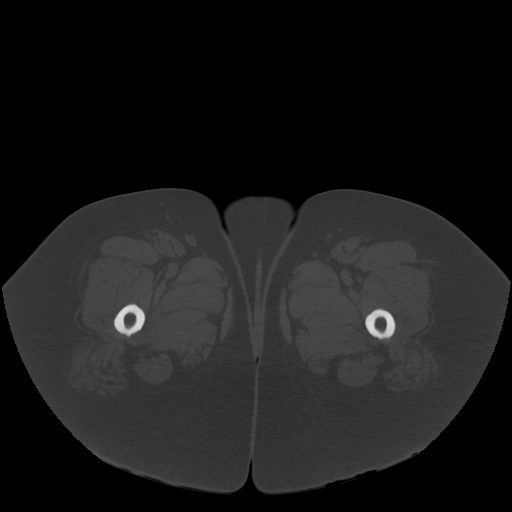
[im 13/102  soft-tissue]
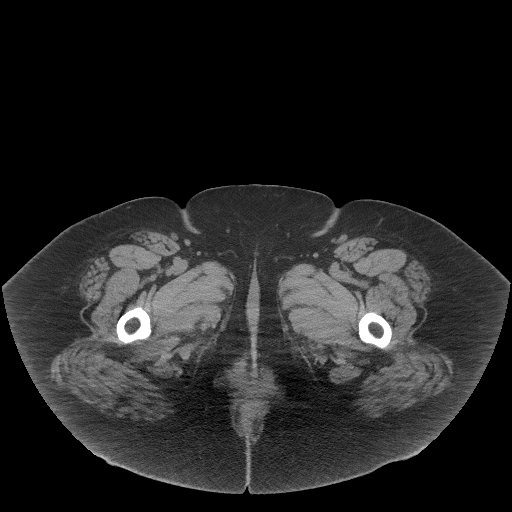
[im 22/102  soft-tissue]
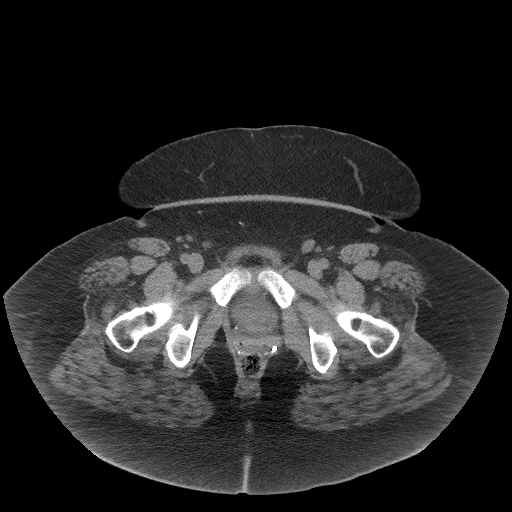
[im 30/102  soft-tissue]
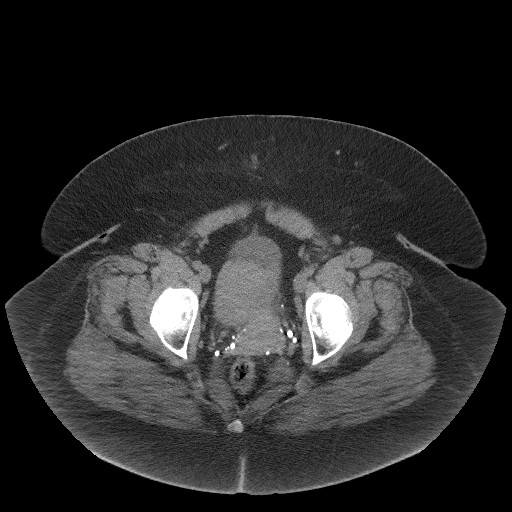
[im 34/102  soft-tissue]
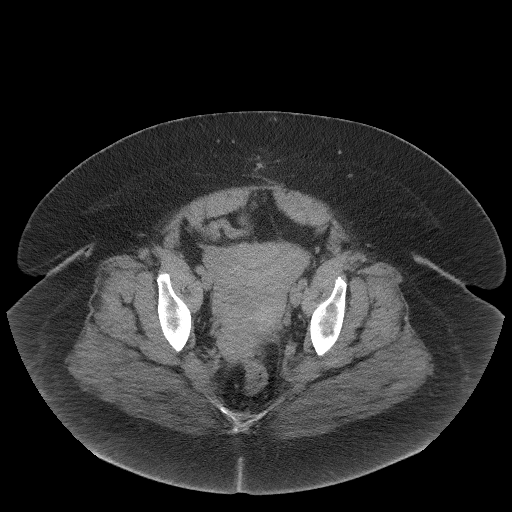
[im 43/102  soft-tissue]
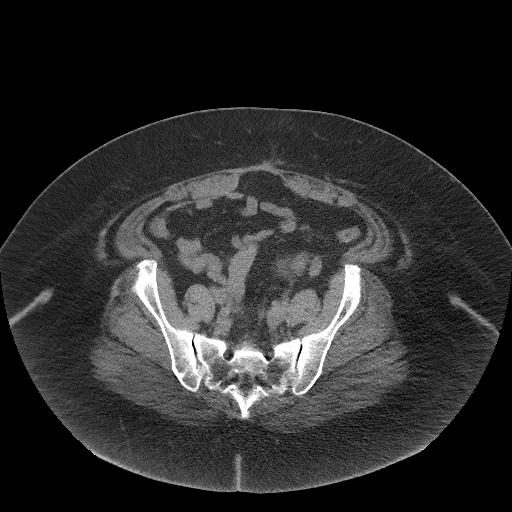
[im 51/102  soft-tissue]
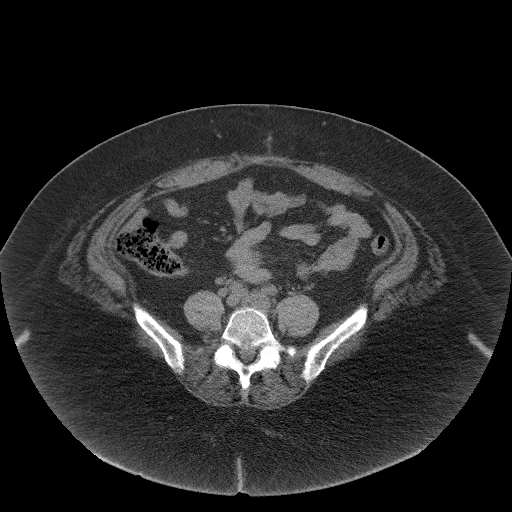
[im 59/102  soft-tissue]
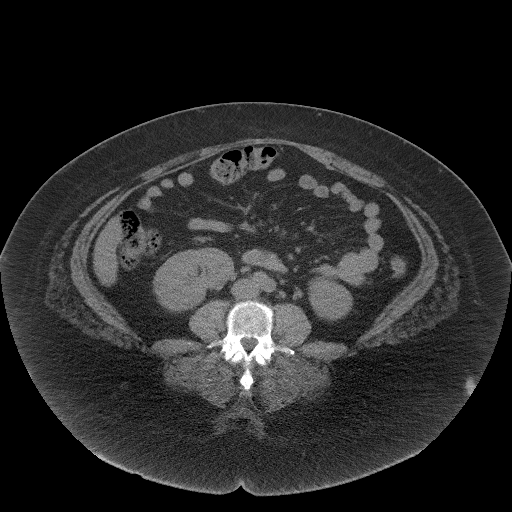
[im 68/102  soft-tissue]
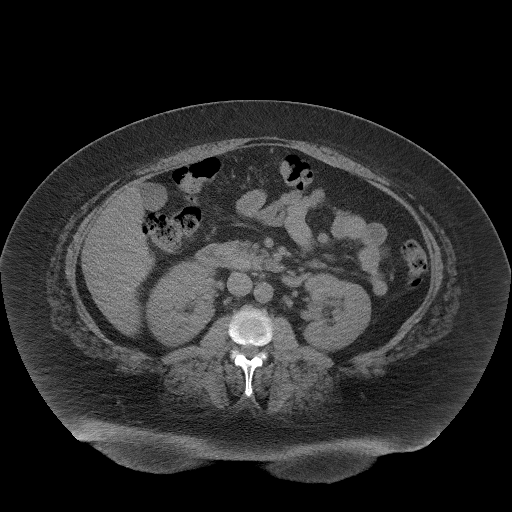
[im 68/102  bone]
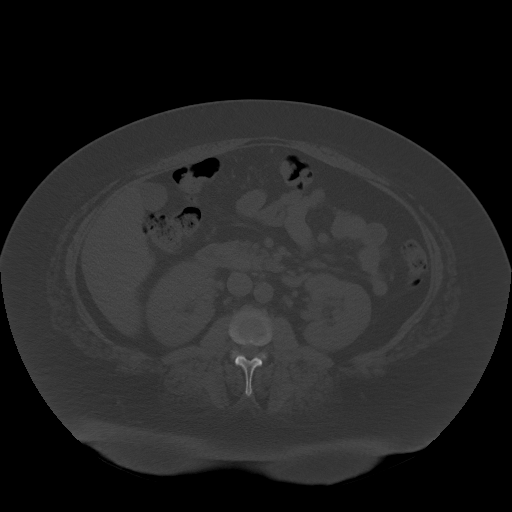
[im 72/102  soft-tissue]
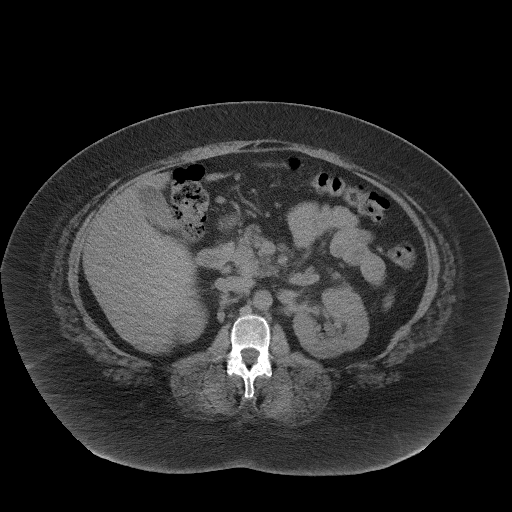
[im 80/102  soft-tissue]
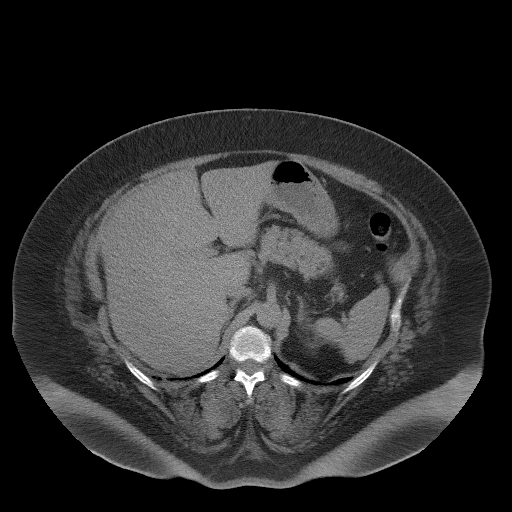
[im 89/102  soft-tissue]
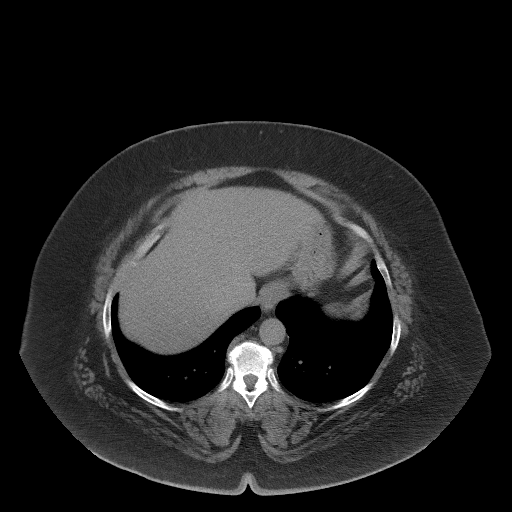
[im 97/102  soft-tissue]
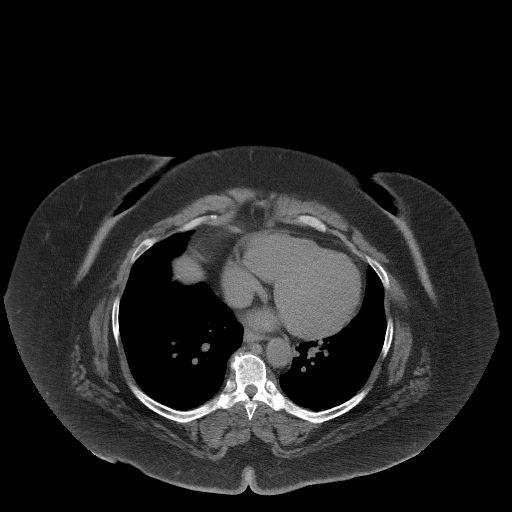

[Series 5: coronal st · coronal · 1.09mm/px · 3 of 122 slices shown]
[im 41/122  soft-tissue]
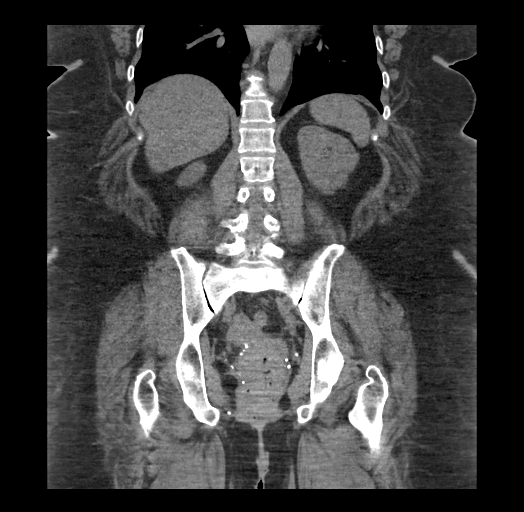
[im 54/122  soft-tissue]
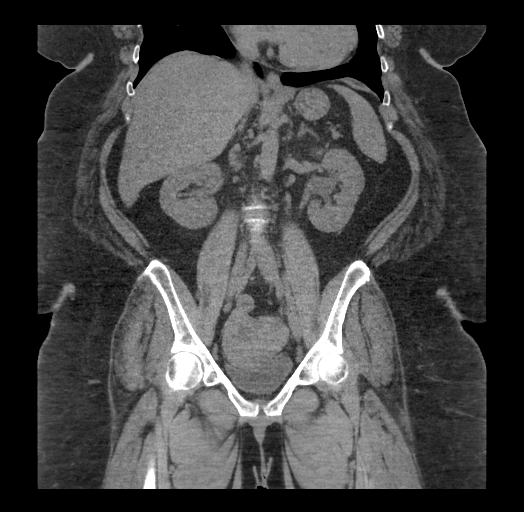
[im 68/122  soft-tissue]
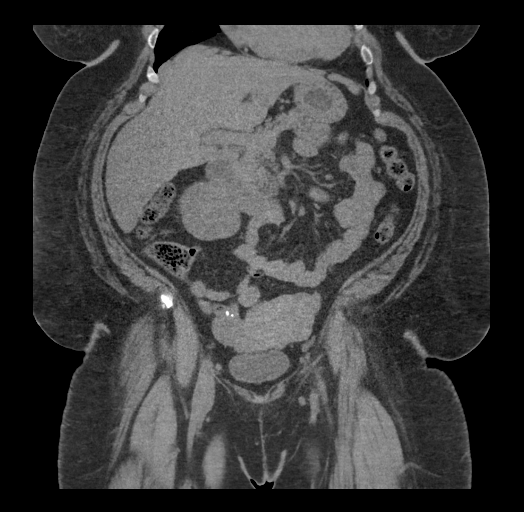

[16 of 46 positions shown; findings below may reference images not displayed]

FINDINGS: Lower chest: Clear lung bases. No significant pleural or pericardial
effusion.

Hepatobiliary: The liver appears unremarkable as imaged in the
noncontrast state. No evidence of gallstones, gallbladder wall
thickening or biliary dilatation.

Pancreas: Unremarkable. No pancreatic ductal dilatation or
surrounding inflammatory changes.

Spleen: Normal in size without focal abnormality.

Adrenals/Urinary Tract: Both adrenal glands appear normal. Probable
tiny bilateral renal calculi, involving the posterior interpolar
region of the right kidney and the lower pole of the left kidney,
best seen on the reformatted images. No evidence hydronephrosis,
perinephric soft tissue stranding or definite ureteral calculus.
Innumerable pelvic calcifications bilaterally are similar to the
previous study and likely all phleboliths. No bladder abnormalities
are seen.

Stomach/Bowel: No enteric contrast administered. The stomach appears
unremarkable for its degree of distension. No evidence of bowel wall
thickening, distention or surrounding inflammatory change. The
appendix appears normal. Mild descending and sigmoid colon
diverticulosis.

Vascular/Lymphatic: There are no enlarged abdominal or pelvic lymph
nodes. Scattered small retroperitoneal and inguinal lymph nodes
bilaterally are stable. Minimal aortic atherosclerosis without acute
vascular findings on noncontrast imaging.

Reproductive: Stable lobulated mild enlargement of the uterus,
likely due to fibroids. No adnexal mass.

Other: Stable postsurgical changes in the anterior abdominal wall
with a small residual periumbilical hernia containing fat. No
ascites or free air.

Musculoskeletal: No acute or significant osseous findings. Prominent
facet hypertrophy in the lower lumbar spine.
IMPRESSION: 1. Probable small nonobstructing bilateral renal calculi. No
evidence of ureteral calculus, hydronephrosis or other secondary
signs of ureteral obstruction.
2. No acute abdominopelvic findings.
3. Stable incidental findings including probable uterine fibroids,
mild distal colonic diverticulosis and lumbar spondylosis.

## 2023-05-23 ENCOUNTER — Emergency Department (HOSPITAL_BASED_OUTPATIENT_CLINIC_OR_DEPARTMENT_OTHER)
Admission: EM | Admit: 2023-05-23 | Discharge: 2023-05-23 | Disposition: A | Discharge status: Home / Self Care | Payer: BC Managed Care – PPO | Attending: Emergency Medicine | Admitting: Emergency Medicine

## 2023-05-23 ENCOUNTER — Other Ambulatory Visit: Payer: Self-pay

## 2023-05-23 ENCOUNTER — Emergency Department (HOSPITAL_BASED_OUTPATIENT_CLINIC_OR_DEPARTMENT_OTHER): Payer: BC Managed Care – PPO

## 2023-05-23 ENCOUNTER — Encounter (HOSPITAL_BASED_OUTPATIENT_CLINIC_OR_DEPARTMENT_OTHER): Payer: Self-pay | Admitting: Emergency Medicine

## 2023-05-23 DIAGNOSIS — E119 Type 2 diabetes mellitus without complications: Secondary | ICD-10-CM | POA: Insufficient documentation

## 2023-05-23 DIAGNOSIS — Z7982 Long term (current) use of aspirin: Secondary | ICD-10-CM | POA: Diagnosis not present

## 2023-05-23 DIAGNOSIS — Z20822 Contact with and (suspected) exposure to covid-19: Secondary | ICD-10-CM | POA: Insufficient documentation

## 2023-05-23 DIAGNOSIS — Z7984 Long term (current) use of oral hypoglycemic drugs: Secondary | ICD-10-CM | POA: Insufficient documentation

## 2023-05-23 DIAGNOSIS — I10 Essential (primary) hypertension: Secondary | ICD-10-CM | POA: Insufficient documentation

## 2023-05-23 DIAGNOSIS — J069 Acute upper respiratory infection, unspecified: Secondary | ICD-10-CM | POA: Diagnosis not present

## 2023-05-23 DIAGNOSIS — Z79899 Other long term (current) drug therapy: Secondary | ICD-10-CM | POA: Insufficient documentation

## 2023-05-23 DIAGNOSIS — R059 Cough, unspecified: Secondary | ICD-10-CM | POA: Diagnosis present

## 2023-05-23 LAB — RESP PANEL BY RT-PCR (RSV, FLU A&B, COVID)  RVPGX2
Influenza A by PCR: NEGATIVE
Influenza B by PCR: NEGATIVE
Resp Syncytial Virus by PCR: NEGATIVE
SARS Coronavirus 2 by RT PCR: NEGATIVE

## 2023-05-23 MED ORDER — ASPIRIN 81 MG PO TBEC: 81.0000 mg | DELAYED_RELEASE_TABLET | Freq: Every day | ORAL | 3 refills | Status: AC

## 2023-05-23 MED ORDER — BENZONATATE 100 MG PO CAPS: 100.0000 mg | ORAL_CAPSULE | Freq: Three times a day (TID) | ORAL | 0 refills | Status: AC | PRN

## 2023-05-23 MED ORDER — PREDNISONE 10 MG PO TABS: 20.0000 mg | ORAL_TABLET | Freq: Every day | ORAL | 0 refills | Status: AC

## 2023-05-23 MED ORDER — CHLORTHALIDONE 25 MG PO TABS: 25.0000 mg | ORAL_TABLET | Freq: Every day | ORAL | 3 refills | Status: AC

## 2023-05-23 MED ORDER — LOSARTAN POTASSIUM 50 MG PO TABS: 50.0000 mg | ORAL_TABLET | Freq: Every day | ORAL | 3 refills | Status: AC

## 2023-05-23 MED ORDER — ALBUTEROL SULFATE HFA 108 (90 BASE) MCG/ACT IN AERS
1.0000 | INHALATION_SPRAY | Freq: Once | RESPIRATORY_TRACT | Status: AC
  Administered 2023-05-23: 2 via RESPIRATORY_TRACT
  Filled 2023-05-23: qty 6.7

## 2023-05-23 MED ORDER — ATORVASTATIN CALCIUM 10 MG PO TABS: 10.0000 mg | ORAL_TABLET | Freq: Every day | ORAL | 3 refills | Status: AC

## 2023-05-23 NOTE — ED Triage Notes (Signed)
Pt arrives pov, steady gait with c/o cough, generalized body aches and HA since yesterday, concern for covid. Requesting covid test and cough meds. Also reports non-compliance with b/p meds

## 2023-05-23 NOTE — ED Notes (Signed)
Patient transported to X-ray 

## 2023-05-23 NOTE — ED Notes (Signed)
Pt discharged to home using teachback Method. Discharge instructions have been discussed with patient and/or family members. Pt verbally acknowledges understanding d/c instructions, has been given opportunity for questions to be answered, and endorses comprehension to checkout at registration before leaving.  

## 2023-05-23 NOTE — ED Notes (Signed)
Patient ambulated to X-ray 

## 2023-05-23 NOTE — ED Provider Notes (Signed)
[place] [facility] AT [facility] Provider Note   CSN: [id number] Arrival date & time: 05/23/23  1102     History  Chief Complaint  Patient presents with   Cough    Kristine Bell is a 52 y.o. female.   Cough   52 year old female presents emergency department with complaints of cough, headache, body aches.  Patient states symptoms began 2 days ago.  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  Reports dry cough that has been constant over the past 2 days.  Denies any chest pain, abdominal pain, nausea, vomiting, urinary symptoms, change in bowel habits.  Reports frontal headache as well as over the past couple of days that she currently is not experiencing.  Denies any visual symptoms, gait abnormality, slurred speech, facial droop, weakness/sensory deficits in upper extremities.  Past medical history significant for diabetes mellitus, GERD, hyperlipidemia, hypertension, kidney stone  Home Medications Prior to Admission medications   Medication Sig Start Date End Date Taking? Authorizing Provider  aspirin EC 81 MG tablet Take 1 tablet (81 mg total) by mouth daily. Swallow whole. 05/23/23  Yes Sherian Maroon A, PA  atorvastatin (LIPITOR) 10 MG tablet Take 1 tablet (10 mg total) by mouth daily. 05/23/23  Yes Sherian Maroon A, PA  benzonatate (TESSALON) 100 MG capsule Take 1 capsule (100 mg total) by mouth 3 (three) times daily as needed for cough. 05/23/23  Yes Sherian Maroon A, PA  chlorthalidone (HYGROTON) 25 MG tablet Take 1 tablet (25 mg total) by mouth daily. 05/23/23  Yes Sherian Maroon A, PA  losartan (COZAAR) 50 MG tablet Take 1 tablet (50 mg total) by mouth daily. 05/23/23  Yes Sherian Maroon A, PA  predniSONE (DELTASONE) 10 MG tablet Take 2 tablets (20 mg total) by mouth daily for 6 days. 05/23/23 05/29/23 Yes Sherian Maroon A, PA  albuterol (VENTOLIN HFA) 108 (90 Base) MCG/ACT inhaler Inhale 1-2 puffs  into the lungs every 6 (six) hours as needed for wheezing or shortness of breath. 08/30/22   Small, Brooke L, PA  buPROPion (WELLBUTRIN XL) 150 MG 24 hr tablet 1 tab po q day 11/03/20   Saguier, Ramon Dredge, PA-C  cyclobenzaprine (FLEXERIL) 10 MG tablet Take 1 tablet (10 mg total) by mouth 2 (two) times daily as needed for muscle spasms. 06/19/22   Renne Crigler, PA-C  cyclobenzaprine (FLEXERIL) 10 MG tablet Take 1 tablet (10 mg total) by mouth 2 (two) times daily as needed for muscle spasms. 11/28/22   Jeanelle Malling, PA  HYDROcodone-acetaminophen (NORCO/VICODIN) 5-325 MG tablet Take 1 tablet by mouth every 4 (four) hours as needed. 03/06/23   Jacalyn Lefevre, MD  lidocaine (HM LIDOCAINE PATCH) 4 % Place 2 patches onto the skin daily. 08/30/22   Small, Brooke L, PA  meloxicam (MOBIC) 7.5 MG tablet Take 1 tablet (7.5 mg total) by mouth daily. 06/19/22   Renne Crigler, PA-C  metFORMIN (GLUCOPHAGE) 500 MG tablet Take 1 tablet (500 mg total) by mouth 2 (two) times daily with a meal. 11/03/20   Saguier, Ramon Dredge, PA-C  methocarbamol (ROBAXIN) 500 MG tablet Take 1 tablet (500 mg total) by mouth 2 (two) times daily as needed for muscle spasms. 03/06/23   Jacalyn Lefevre, MD  Multiple Vitamin (MULTI-VITAMIN) tablet Take 1 tablet by mouth daily.    [provider]  omeprazole (PRILOSEC) 20 MG capsule Take 1 capsule (20 mg total) by mouth daily. 11/03/20   Saguier, Ramon Dredge, PA-C  oseltamivir (TAMIFLU)  75 MG capsule Take 1 capsule (75 mg total) by mouth every 12 (twelve) hours. 08/30/22   Small, Brooke L, PA  oxybutynin (DITROPAN) 5 MG tablet Take 5 mg by mouth 2 (two) times daily. 09/01/20   [provider]  oxyCODONE-acetaminophen (PERCOCET/ROXICET) 5-325 MG tablet Take 1 tablet by mouth every 6 (six) hours as needed for up to 10 doses for severe pain. 11/28/22   Jeanelle Malling, PA  traZODone (DESYREL) 50 MG tablet Take 0.5-1 tablets (25-50 mg total) by mouth at bedtime as needed for sleep. 11/03/20   Saguier, Ramon Dredge,  PA-C      Allergies    Naproxen sodium, Naproxen, Sertraline, and Tramadol    Review of Systems   Review of Systems  Respiratory:  Positive for cough.   All other systems reviewed and are negative.   Physical Exam Updated Vital Signs BP (!) 181/73   Pulse 66   Temp 98.1 F (36.7 C) (Oral)   Resp 16   Wt (!) 147.4 kg   SpO2 97%   BMI 54.08 kg/m  Physical Exam Vitals and nursing note reviewed.  Constitutional:      General: She is not in acute distress.    Appearance: She is well-developed.  HENT:     Head: Normocephalic and atraumatic.  Eyes:     Conjunctiva/sclera: Conjunctivae normal.  Cardiovascular:     Rate and Rhythm: Normal rate and regular rhythm.     Heart sounds: No murmur heard. Pulmonary:     Effort: Pulmonary effort is normal. No respiratory distress.     Comments: Mild wheeze bilateral lung fields. Abdominal:     Palpations: Abdomen is soft.     Tenderness: There is no abdominal tenderness.  Musculoskeletal:        General: No swelling.     Cervical back: Normal range of motion and neck supple. No rigidity.  Skin:    General: Skin is warm and dry.     Capillary Refill: Capillary refill takes less than 2 seconds.  Neurological:     Mental Status: She is alert.  Psychiatric:        Mood and Affect: Mood normal.     ED Results / Procedures / Treatments   Labs (all labs ordered are listed, but only abnormal results are displayed) Labs Reviewed  RESP PANEL BY RT-PCR (RSV, FLU A&B, COVID)  [id number]    EKG None  Radiology DG Chest 2 View  Result Date: 05/23/2023 CLINICAL DATA:  cough EXAM: CHEST - 2 VIEW COMPARISON:  11/28/2022 FINDINGS: Cardiac silhouette enlarged. No evidence of pneumothorax or pleural effusion. No evidence of pulmonary edema. No osseous abnormalities identified. IMPRESSION: Enlarged cardiac silhouette.  Lungs are clear. Electronically Signed   By: Layla Maw M.D.   On: 05/23/2023 13:16    Procedures Procedures     Medications Ordered in ED Medications  albuterol (VENTOLIN HFA) 108 (90 Base) MCG/ACT inhaler 1-2 puff (2 puffs Inhalation Given 05/23/23 1253)    ED Course/ Medical Decision Making/ A&P                                 Medical Decision Making Amount and/or Complexity of Data Reviewed Radiology: ordered.  Risk OTC drugs. Prescription drug management.   This patient presents to the ED for concern of cough, headache, body aches, this involves an extensive number of treatment options, and is a complaint that carries with it a  high risk of complications and morbidity.  The differential diagnosis includes pneumonia, influenza, RSV, COVID   Co morbidities that complicate the patient evaluation  See HPI   Additional history obtained:  Additional history obtained from EMR External records from outside source obtained and reviewed including hospital records   Lab Tests:  I Ordered, and personally interpreted labs.  The pertinent results include: Respiratory panel negative   Imaging Studies ordered:  I ordered imaging studies including chest x-ray I independently visualized and interpreted imaging which showed no acute cardiopulmonary abnormalities. I agree with the radiologist interpretation   Cardiac Monitoring: / EKG:  The patient was maintained on a cardiac monitor.  I personally viewed and interpreted the cardiac monitored which showed an underlying rhythm of: Sinus rhythm   Consultations Obtained:  N/a   Problem List / ED Course / Critical interventions / Medication management  Cough Reevaluation of the patient showed that the patient stayed the same I have reviewed the patients home medicines and have made adjustments as needed   Social Determinants of Health:  Chronic tobacco use.  Denies illicit drug use.   Test / Admission - Considered:  Cough, body aches Vitals signs significant for hypertension blood pressure 169/76. Otherwise within normal  range and stable throughout visit. Laboratory/imaging studies significant for: See above 52 year old female presents emergency department with 2 days of cough, body aches, mild headache.  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  On exam, patient with some mild wheezing but without appreciable rales/rhonchi.  Negative viral testing today with chest x-ray without signs of pneumonia or other abnormality.  Patient did note some improvement of breathing with albuterol.  With history of chronic smoking, will treat with prednisone as well as albuterol in the outpatient setting.  Will recommend other symptomatic therapy for viral URI as depicted in AVS.  Patient initially asking for refills of her antihypertensive medications as well as her cholesterol medications of which will be sent into her local pharmacy.  Close follow-up with primary care recommended for reevaluation of symptoms.  Patient overall well-appearing, afebrile in no acute distress. Worrisome signs and symptoms were discussed with the patient, and the patient acknowledged understanding to return to the ED if noticed. Patient was stable upon discharge.          Final Clinical Impression(s) / ED Diagnoses Final diagnoses:  Viral URI with cough    Rx / DC Orders ED Discharge Orders          Ordered    aspirin EC 81 MG tablet  Daily        05/23/23 1325    atorvastatin (LIPITOR) 10 MG tablet  Daily        05/23/23 1325    chlorthalidone (HYGROTON) 25 MG tablet  Daily        05/23/23 1325    losartan (COZAAR) 50 MG tablet  Daily        05/23/23 1325    predniSONE (DELTASONE) 10 MG tablet  Daily        05/23/23 1325    benzonatate (TESSALON) 100 MG capsule  3 times daily PRN        05/23/23 1325              Peter Garter, [place] 05/23/23 1614    Virgina Norfolk, DO 05/26/23 1032

## 2023-05-23 NOTE — ED Notes (Signed)
Pt. Was seen and assessed by EDP and treated accordingly.

## 2023-05-23 NOTE — Discharge Instructions (Addendum)
As discussed, workup today overall reassuring.  You tested negative for COVID, influenza and RSV.  I do think your symptoms are most likely secondary to upper respiratory viral illness.  Chest x-ray is without signs of pneumonia or other abnormality.  Recommend continue use of albuterol inhaler as needed for wheeze as well as prednisone.  Will send in cough suppressant to use as needed.  Also refill your blood pressure as well as your cholesterol medication.  Recommend calling number attached to discharge papers to set up an appointment with primary care provider.  Please not hesitate to return to emergency department if there are worrisome signs and symptoms we discussed become apparent.

## 2023-07-16 ENCOUNTER — Emergency Department (HOSPITAL_BASED_OUTPATIENT_CLINIC_OR_DEPARTMENT_OTHER): Payer: BC Managed Care – PPO

## 2023-07-16 ENCOUNTER — Emergency Department (HOSPITAL_BASED_OUTPATIENT_CLINIC_OR_DEPARTMENT_OTHER)
Admission: EM | Admit: 2023-07-16 | Discharge: 2023-07-16 | Disposition: A | Discharge status: Home / Self Care | Payer: BC Managed Care – PPO | Attending: Emergency Medicine | Admitting: Emergency Medicine

## 2023-07-16 ENCOUNTER — Other Ambulatory Visit: Payer: Self-pay

## 2023-07-16 ENCOUNTER — Encounter (HOSPITAL_BASED_OUTPATIENT_CLINIC_OR_DEPARTMENT_OTHER): Payer: Self-pay | Admitting: Emergency Medicine

## 2023-07-16 DIAGNOSIS — G8929 Other chronic pain: Secondary | ICD-10-CM | POA: Insufficient documentation

## 2023-07-16 DIAGNOSIS — E119 Type 2 diabetes mellitus without complications: Secondary | ICD-10-CM | POA: Diagnosis not present

## 2023-07-16 DIAGNOSIS — I10 Essential (primary) hypertension: Secondary | ICD-10-CM | POA: Diagnosis not present

## 2023-07-16 DIAGNOSIS — Z7982 Long term (current) use of aspirin: Secondary | ICD-10-CM | POA: Diagnosis not present

## 2023-07-16 DIAGNOSIS — Z7984 Long term (current) use of oral hypoglycemic drugs: Secondary | ICD-10-CM | POA: Diagnosis not present

## 2023-07-16 DIAGNOSIS — M25511 Pain in right shoulder: Secondary | ICD-10-CM | POA: Diagnosis present

## 2023-07-16 DIAGNOSIS — Z79899 Other long term (current) drug therapy: Secondary | ICD-10-CM | POA: Diagnosis not present

## 2023-07-16 MED ORDER — CYCLOBENZAPRINE HCL 10 MG PO TABS: 10.0000 mg | ORAL_TABLET | Freq: Two times a day (BID) | ORAL | 0 refills | Status: DC | PRN

## 2023-07-16 NOTE — Discharge Instructions (Addendum)
You will likely benefit from further management by an orthopedist for your chronic right shoulder pain.  You may take muscle relaxant at nighttime to help with sleep.  You may continue to take anti-inflammatory medication as needed for pain.

## 2023-07-16 NOTE — ED Triage Notes (Signed)
Right shoulder pain for years.  Pain has worsened recently.  Pt had been seeing sports medicine and is supposed to have an MRI but didn't have insurance at that time.

## 2023-07-16 NOTE — ED Provider Notes (Signed)
[place] [facility] Provider Note   CSN: [id number] Arrival date & time: 07/16/23  1046     History  Chief Complaint  Patient presents with   Shoulder Pain    Kristine Bell is a 52 y.o. female.  The history is provided by the patient and medical records. No language interpreter was used.  Shoulder Pain    52 year old female with significant history of diabetes, obesity, hypertension, panic attack, adhesive capsulitis of the right shoulder presenting with complaint of shoulder pain.  Patient states she has had recurrent pain to her right shoulder ongoing for years but is becoming progressively worse.  She described pain as a sharp achy sensation about her right shoulder worse at nighttime and increased when she tries to put on her bra or change her clothes.  Initially it did improve with taking NSAIDs but now NSAIDs does not provide adequate relief.  No fever no neck pain no injury no elbow or wrist pain and no numbness or chest pain.  She fell she would likely need a shoulder replacement surgery but she is no longer following up with any orthopedic specialist because she does not have insurance.  She is here hoping to get an injection in her shoulder as it has helped in the past.  Home Medications Prior to Admission medications   Medication Sig Start Date End Date Taking? Authorizing Provider  albuterol (VENTOLIN HFA) 108 (90 Base) MCG/ACT inhaler Inhale 1-2 puffs into the lungs every 6 (six) hours as needed for wheezing or shortness of breath. 08/30/22   Small, Brooke L, PA  aspirin EC 81 MG tablet Take 1 tablet (81 mg total) by mouth daily. Swallow whole. 05/23/23   Peter Garter, PA  atorvastatin (LIPITOR) 10 MG tablet Take 1 tablet (10 mg total) by mouth daily. 05/23/23   Peter Garter, PA  benzonatate (TESSALON) 100 MG capsule Take 1 capsule (100 mg total) by mouth 3 (three) times daily as needed for cough. 05/23/23   Peter Garter, PA   buPROPion (WELLBUTRIN XL) 150 MG 24 hr tablet 1 tab po q day 11/03/20   Saguier, Ramon Dredge, PA-C  chlorthalidone (HYGROTON) 25 MG tablet Take 1 tablet (25 mg total) by mouth daily. 05/23/23   Peter Garter, PA  cyclobenzaprine (FLEXERIL) 10 MG tablet Take 1 tablet (10 mg total) by mouth 2 (two) times daily as needed for muscle spasms. 06/19/22   Renne Crigler, PA-C  cyclobenzaprine (FLEXERIL) 10 MG tablet Take 1 tablet (10 mg total) by mouth 2 (two) times daily as needed for muscle spasms. 11/28/22   Jeanelle Malling, PA  HYDROcodone-acetaminophen (NORCO/VICODIN) 5-325 MG tablet Take 1 tablet by mouth every 4 (four) hours as needed. 03/06/23   Jacalyn Lefevre, MD  lidocaine (HM LIDOCAINE PATCH) 4 % Place 2 patches onto the skin daily. 08/30/22   Small, Brooke L, PA  losartan (COZAAR) 50 MG tablet Take 1 tablet (50 mg total) by mouth daily. 05/23/23   Peter Garter, PA  meloxicam (MOBIC) 7.5 MG tablet Take 1 tablet (7.5 mg total) by mouth daily. 06/19/22   Renne Crigler, PA-C  metFORMIN (GLUCOPHAGE) 500 MG tablet Take 1 tablet (500 mg total) by mouth 2 (two) times daily with a meal. 11/03/20   Saguier, Ramon Dredge, PA-C  methocarbamol (ROBAXIN) 500 MG tablet Take 1 tablet (500 mg total) by mouth 2 (two) times daily as needed for muscle spasms. 03/06/23   Jacalyn Lefevre, MD  Multiple Vitamin (MULTI-VITAMIN) tablet  Take 1 tablet by mouth daily.    [provider]  omeprazole (PRILOSEC) 20 MG capsule Take 1 capsule (20 mg total) by mouth daily. 11/03/20   Saguier, Ramon Dredge, PA-C  oseltamivir (TAMIFLU) 75 MG capsule Take 1 capsule (75 mg total) by mouth every 12 (twelve) hours. 08/30/22   Small, Brooke L, PA  oxybutynin (DITROPAN) 5 MG tablet Take 5 mg by mouth 2 (two) times daily. 09/01/20   [provider]  oxyCODONE-acetaminophen (PERCOCET/ROXICET) 5-325 MG tablet Take 1 tablet by mouth every 6 (six) hours as needed for up to 10 doses for severe pain. 11/28/22   Jeanelle Malling, PA  traZODone (DESYREL) 50 MG  tablet Take 0.5-1 tablets (25-50 mg total) by mouth at bedtime as needed for sleep. 11/03/20   Saguier, Ramon Dredge, PA-C      Allergies    Naproxen sodium, Naproxen, Sertraline, and Tramadol    Review of Systems   Review of Systems  All other systems reviewed and are negative.   Physical Exam Updated Vital Signs BP (!) 156/90 (BP Location: Left Arm)   Pulse 82   Temp 97.9 F (36.6 C) (Oral)   Resp 18   Ht 5\' 5"  (1.651 m)   Wt (!) 154.2 kg   SpO2 96%   BMI 56.58 kg/m  Physical Exam Vitals and nursing note reviewed.  Constitutional:      General: She is not in acute distress.    Appearance: She is well-developed. She is obese.  HENT:     Head: Atraumatic.  Eyes:     Conjunctiva/sclera: Conjunctivae normal.  Pulmonary:     Effort: Pulmonary effort is normal.  Musculoskeletal:        General: Tenderness (Right shoulder with tenderness to palpation about the shoulder without point tenderness.  Decreased active range of motion.  I was able to passively range the shoulder with pain.  No overlying skin changes no deformity.) present.     Cervical back: Normal range of motion and neck supple. No tenderness.  Skin:    Findings: No rash.  Neurological:     Mental Status: She is alert.  Psychiatric:        Mood and Affect: Mood normal.     ED Results / Procedures / Treatments   Labs (all labs ordered are listed, but only abnormal results are displayed) Labs Reviewed - No data to display  EKG None  Radiology No results found.  Procedures Procedures    Medications Ordered in ED Medications - No data to display  ED Course/ Medical Decision Making/ A&P                                 Medical Decision Making Amount and/or Complexity of Data Reviewed Radiology: ordered.   BP (!) 156/90 (BP Location: Left Arm)   Pulse 82   Temp 97.9 F (36.6 C) (Oral)   Resp 18   Ht 5\' 5"  (1.651 m)   Wt (!) 154.2 kg   SpO2 96%   BMI 56.58 kg/m   17:31 PM  52 year old  female with significant history of diabetes, obesity, hypertension, panic attack, adhesive capsulitis of the right shoulder presenting with complaint of shoulder pain.  Patient states she has had recurrent pain to her right shoulder ongoing for years but is becoming progressively worse.  She described pain as a sharp achy sensation about her right shoulder worse at nighttime and increased when she  tries to put on her bra or change her clothes.  Initially it did improve with taking NSAIDs but now NSAIDs does not provide adequate relief.  No fever no neck pain no injury no elbow or wrist pain and no numbness or chest pain.  She fell she would likely need a shoulder replacement surgery but she is no longer following up with any orthopedic specialist because she does not have insurance.  She is here hoping to get an injection in her shoulder as it has helped in the past.  On exam this is an obese female sitting in the chair appears to be in no acute discomfort.  She does have tenderness about her right shoulder with range of motion but no obvious deformity noted.  She has decreased range of motion with active range of motion but I was able to range her shoulder.  She has history of adhesive capsulitis however I have low suspicion that that is the actual cause of her ongoing pain.  The pain is chronic in nature and there is no concern for acute fracture or dislocation and there is no infectious finding on my initial exam.  Will prescribe muscle relaxant but I encouraged patient to follow-up closely with orthopedist for outpatient management of her condition.  Return precaution given.  Imaging including x-ray of right shoulder considered but not performed as patient denies any recent injury and her pain is chronic in nature.        Final Clinical Impression(s) / ED Diagnoses Final diagnoses:  Chronic right shoulder pain    Rx / DC Orders ED Discharge Orders          Ordered    cyclobenzaprine (FLEXERIL)  10 MG tablet  2 times daily PRN        07/16/23 1247              Fayrene Helper, PA-C 07/16/23 1248    Trifan, Kermit Balo, MD 07/16/23 [phone]

## 2023-10-21 ENCOUNTER — Other Ambulatory Visit: Payer: Self-pay

## 2023-10-21 ENCOUNTER — Emergency Department (HOSPITAL_BASED_OUTPATIENT_CLINIC_OR_DEPARTMENT_OTHER)
Admission: EM | Admit: 2023-10-21 | Discharge: 2023-10-21 | Disposition: A | Discharge status: Home / Self Care | Payer: 59 | Attending: Emergency Medicine | Admitting: Emergency Medicine

## 2023-10-21 ENCOUNTER — Encounter (HOSPITAL_BASED_OUTPATIENT_CLINIC_OR_DEPARTMENT_OTHER): Payer: Self-pay | Admitting: Emergency Medicine

## 2023-10-21 DIAGNOSIS — Z7982 Long term (current) use of aspirin: Secondary | ICD-10-CM | POA: Insufficient documentation

## 2023-10-21 DIAGNOSIS — Z7984 Long term (current) use of oral hypoglycemic drugs: Secondary | ICD-10-CM | POA: Diagnosis not present

## 2023-10-21 DIAGNOSIS — E119 Type 2 diabetes mellitus without complications: Secondary | ICD-10-CM | POA: Diagnosis not present

## 2023-10-21 DIAGNOSIS — M5432 Sciatica, left side: Secondary | ICD-10-CM | POA: Diagnosis present

## 2023-10-21 DIAGNOSIS — M25551 Pain in right hip: Secondary | ICD-10-CM | POA: Diagnosis not present

## 2023-10-21 DIAGNOSIS — M25552 Pain in left hip: Secondary | ICD-10-CM | POA: Insufficient documentation

## 2023-10-21 MED ORDER — CYCLOBENZAPRINE HCL 10 MG PO TABS: 10.0000 mg | ORAL_TABLET | Freq: Two times a day (BID) | ORAL | 0 refills | Status: DC | PRN

## 2023-10-21 MED ORDER — KETOROLAC TROMETHAMINE 30 MG/ML IJ SOLN
30.0000 mg | Freq: Once | INTRAMUSCULAR | Status: AC
  Administered 2023-10-21: 30 mg via INTRAMUSCULAR
  Filled 2023-10-21: qty 1

## 2023-10-21 NOTE — Discharge Instructions (Addendum)
 You were seen today for hip pain. Your symptoms are consistent with sciatica. Take ibuprofen for pain as well as flexeril  as needed. I would advise that you follow up with your primary care provider for further evaluation. For any new or worsening symptoms, please return to the ER.

## 2023-10-21 NOTE — ED Provider Notes (Signed)
 [facility] Provider Note   CSN: [id number] Arrival date & time: 10/21/23  1234     History Chief Complaint  Patient presents with   Hip Pain    Kristine Bell is a 53 y.o. female.  Patient past history significant for sciatica, morbid obesity, type 2 diabetes presents to ED with concerns of hip pain.  She reports that she is having bilateral hip pain with radiating pain down the left leg.  Endorses a history of sciatica.  States this feels similar to her prior episodes of sciatica.  She denies any loss of bowel or bladder control, or saddle paresthesia.  No recent injury, fall, or trauma to the area.   Hip Pain       Home Medications Prior to Admission medications   Medication Sig Start Date End Date Taking? Authorizing Provider  cyclobenzaprine  (FLEXERIL ) 10 MG tablet Take 1 tablet (10 mg total) by mouth 2 (two) times daily as needed for muscle spasms. 10/21/23  Yes Fateh Kindle A, PA-C  albuterol  (VENTOLIN  HFA) 108 (90 Base) MCG/ACT inhaler Inhale 1-2 puffs into the lungs every 6 (six) hours as needed for wheezing or shortness of breath. 08/30/22   Small, Brooke L, PA  aspirin  EC 81 MG tablet Take 1 tablet (81 mg total) by mouth daily. Swallow whole. 05/23/23   Silver Wonda LABOR, PA  atorvastatin  (LIPITOR) 10 MG tablet Take 1 tablet (10 mg total) by mouth daily. 05/23/23   Silver Wonda LABOR, PA  benzonatate  (TESSALON ) 100 MG capsule Take 1 capsule (100 mg total) by mouth 3 (three) times daily as needed for cough. 05/23/23   Silver Wonda LABOR, PA  buPROPion  (WELLBUTRIN  XL) 150 MG 24 hr tablet 1 tab po q day 11/03/20   Saguier, [place], PA-C  chlorthalidone  (HYGROTON ) 25 MG tablet Take 1 tablet (25 mg total) by mouth daily. 05/23/23   Silver Wonda LABOR, PA  HYDROcodone -acetaminophen  (NORCO/VICODIN) 5-325 MG tablet Take 1 tablet by mouth every 4 (four) hours as needed. 03/06/23   Dean Clarity, MD  lidocaine  (HM LIDOCAINE  PATCH) 4 % Place 2  patches onto the skin daily. 08/30/22   Small, Brooke L, PA  losartan  (COZAAR ) 50 MG tablet Take 1 tablet (50 mg total) by mouth daily. 05/23/23   Silver Wonda A, PA  meloxicam  (MOBIC ) 7.5 MG tablet Take 1 tablet (7.5 mg total) by mouth daily. 06/19/22   Geiple, Joshua, PA-C  metFORMIN  (GLUCOPHAGE ) 500 MG tablet Take 1 tablet (500 mg total) by mouth 2 (two) times daily with a meal. 11/03/20   Saguier, Dallas, PA-C  methocarbamol  (ROBAXIN ) 500 MG tablet Take 1 tablet (500 mg total) by mouth 2 (two) times daily as needed for muscle spasms. 03/06/23   Dean Clarity, MD  Multiple Vitamin (MULTI-VITAMIN) tablet Take 1 tablet by mouth daily.    [provider]  omeprazole  (PRILOSEC) 20 MG capsule Take 1 capsule (20 mg total) by mouth daily. 11/03/20   Saguier, Dallas, PA-C  oseltamivir  (TAMIFLU ) 75 MG capsule Take 1 capsule (75 mg total) by mouth every 12 (twelve) hours. 08/30/22   Small, Brooke L, PA  oxybutynin (DITROPAN) 5 MG tablet Take 5 mg by mouth 2 (two) times daily. 09/01/20   [provider]  oxyCODONE -acetaminophen  (PERCOCET/ROXICET) 5-325 MG tablet Take 1 tablet by mouth every 6 (six) hours as needed for up to 10 doses for severe pain. 11/28/22   Ladora Congress, PA  traZODone  (DESYREL ) 50 MG tablet Take 0.5-1 tablets (25-50  mg total) by mouth at bedtime as needed for sleep. 11/03/20   Saguier, Dallas, PA-C      Allergies    Naproxen sodium, Naproxen, Sertraline, and Tramadol    Review of Systems   Review of Systems  Musculoskeletal:  Positive for back pain.  All other systems reviewed and are negative.   Physical Exam Updated Vital Signs BP (!) 157/59   Pulse 69   Temp 98.5 F (36.9 C)   Resp (!) 22   Ht 5' 5 (1.651 m)   Wt (!) 154.2 kg   SpO2 99%   BMI 56.58 kg/m  Physical Exam Vitals and nursing note reviewed.  Constitutional:      General: She is not in acute distress.    Appearance: She is well-developed.  HENT:     Head: Normocephalic and atraumatic.   Eyes:     Conjunctiva/sclera: Conjunctivae normal.  Cardiovascular:     Rate and Rhythm: Normal rate and regular rhythm.     Heart sounds: No murmur heard. Pulmonary:     Effort: Pulmonary effort is normal. No respiratory distress.     Breath sounds: Normal breath sounds.  Abdominal:     Palpations: Abdomen is soft.     Tenderness: There is no abdominal tenderness.  Musculoskeletal:        General: No swelling.     Cervical back: Neck supple.     Comments: Positive straight leg raise on left side.  TTP with deep palpation along bilateral hips.  Skin:    General: Skin is warm and dry.     Capillary Refill: Capillary refill takes less than 2 seconds.  Neurological:     Mental Status: She is alert.  Psychiatric:        Mood and Affect: Mood normal.     ED Results / Procedures / Treatments   Labs (all labs ordered are listed, but only abnormal results are displayed) Labs Reviewed - No data to display  EKG None  Radiology No results found.  Procedures Procedures   Medications Ordered in ED Medications  ketorolac  (TORADOL ) 30 MG/ML injection 30 mg (30 mg Intramuscular Given 10/21/23 1512)    ED Course/ Medical Decision Making/ A&P                                 Medical Decision Making Risk Prescription drug management.   This patient presents to the ED for concern of hip pain. Differential diagnosis includes arthritis, pelvic fracture, sciatica   Medicines ordered and prescription drug management:  I ordered medication including Toradol  for pain Reevaluation of the patient after these medicines showed that the patient improved I have reviewed the patients home medicines and have made adjustments as needed   Problem List / ED Course:  Patient with past history significant for sciatica, with obesity, type 2 diabetes presents to the ED with concerns of hip pain.  She states that she has previously experienced sciatica with similar symptoms.  Currently feels  the pain is radiating from her hip down to her left leg.  Denies any weakness, numbness, or tingling.  No saddle paresthesia, loss of bowel or bladder control. Physical exam reveals some tenderness palpation along deep palpation of the posterior hips.  Positive straight leg raise on the left.  DTRs symmetric and intact in bilateral lower extremities.  Symptoms at this time are consistent with sciatica. Will administer a dose of Toradol  here  in the ED.  Patient's chart reports a history of allergy to naproxen but patient's previously received Toradol  in 2023 without any reaction.  Advise continued management of symptoms at home with anti-inflammatory medications.  Also advise gentle stretching of the area.  Discussed expectant management with symptom duration typically lasting several weeks.  Encourage patient to seek further evaluation if she has any new or worsening symptoms.  Also encouraged follow-up with spine specialist symptoms not improving within 6 weeks.  No other acute or focal concerns at this time.  No acute negation for imaging.  Patient otherwise stable and discharged home.   Final Clinical Impression(s) / ED Diagnoses Final diagnoses:  Sciatica of left side    Rx / DC Orders ED Discharge Orders          Ordered    cyclobenzaprine  (FLEXERIL ) 10 MG tablet  2 times daily PRN        10/21/23 1534              Kelin Borum A, PA-C 10/21/23 1534    Pamella Ozell LABOR, DO 10/25/23 [phone]

## 2023-10-21 NOTE — ED Triage Notes (Signed)
 Pt c/o bil hip pain x about 2 wks; no injury; LT side radiates down to toes

## 2023-11-18 IMAGING — DX DG CHEST 1V PORT
1 series · 1 of 1 positions shown · non-contrast
Comparison: Chest radiograph 09/15/2018

CLINICAL DATA: cough x 2 weeks, shortness of breath

EXAM:
PORTABLE CHEST 1 VIEW

[chest ap]
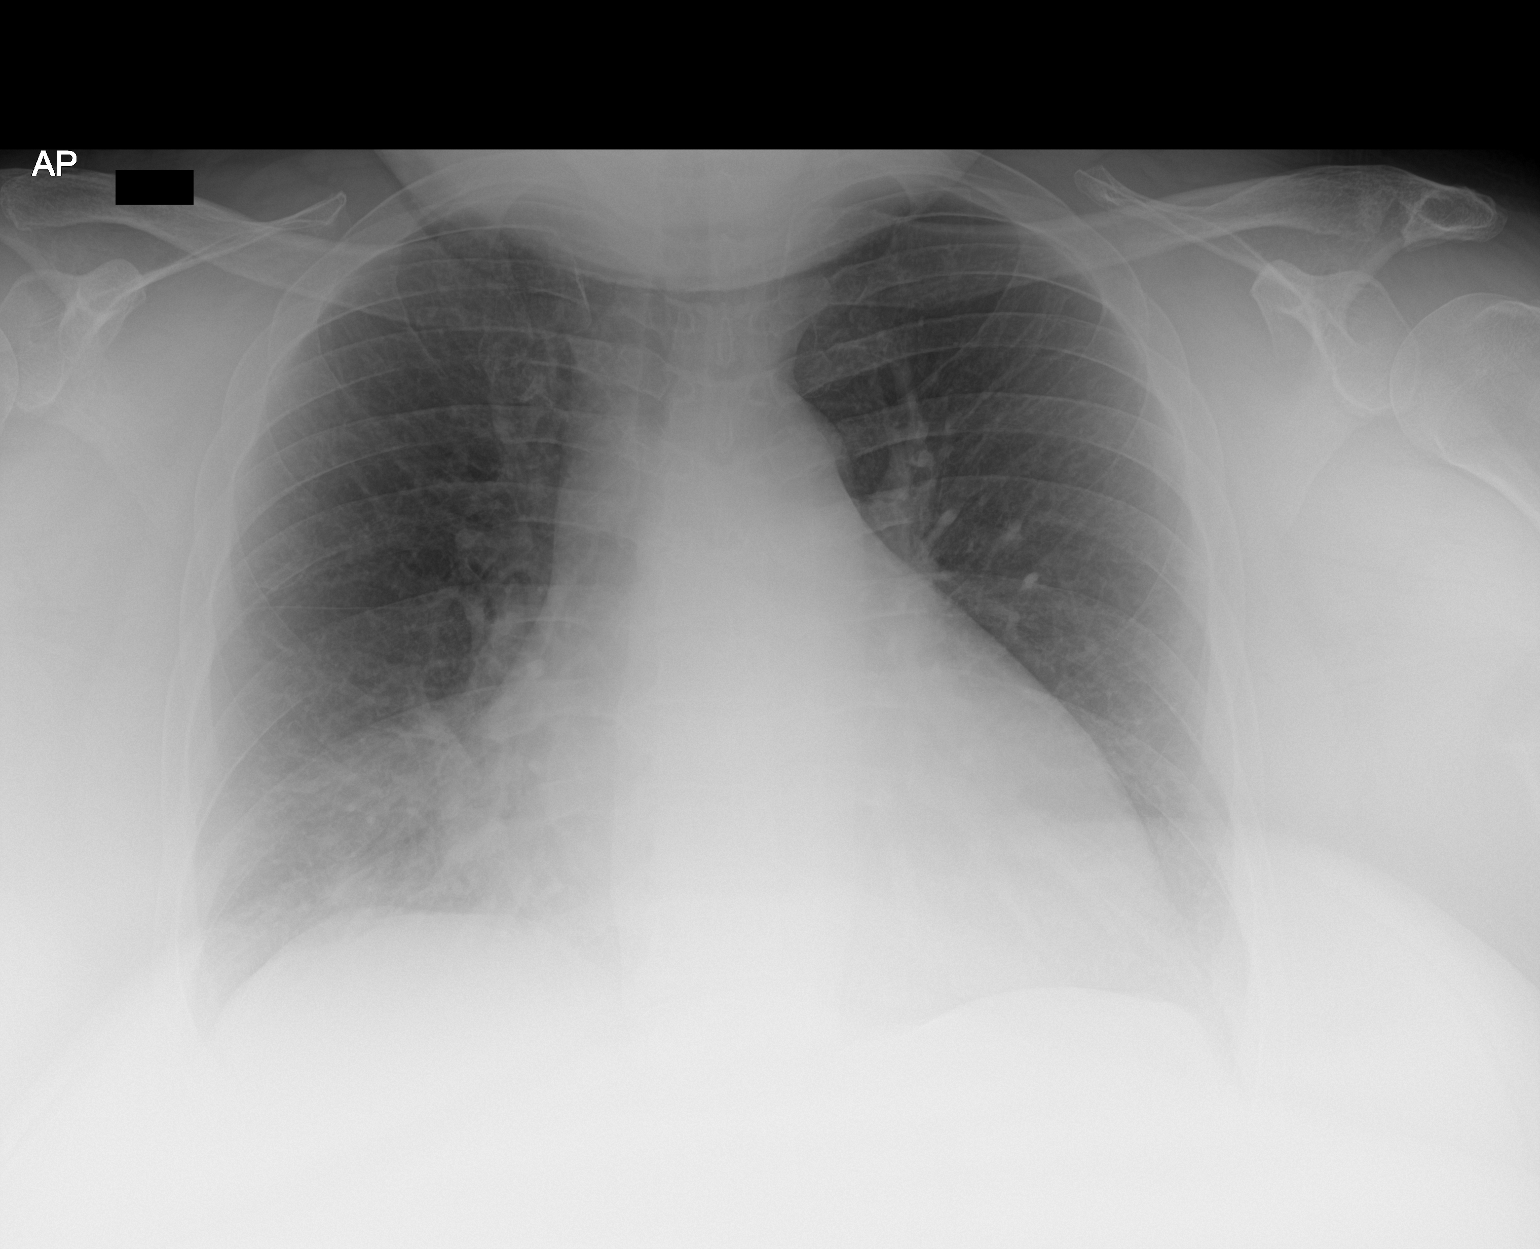

[1 of 1 positions shown; findings below may reference images not displayed]

FINDINGS: Streaky bibasilar opacities. No visible pleural effusions or
pneumothorax. Similar enlarged cardiac silhouette.
IMPRESSION: 1. Streaky bibasilar opacities, which could represent atelectasis
and/or pneumonia.
2. Similar cardiomegaly.

## 2023-11-24 ENCOUNTER — Other Ambulatory Visit: Payer: Self-pay

## 2023-11-24 ENCOUNTER — Emergency Department (HOSPITAL_BASED_OUTPATIENT_CLINIC_OR_DEPARTMENT_OTHER)
Admission: EM | Admit: 2023-11-24 | Discharge: 2023-11-24 | Disposition: A | Discharge status: Home / Self Care | Attending: Emergency Medicine | Admitting: Emergency Medicine

## 2023-11-24 ENCOUNTER — Encounter (HOSPITAL_BASED_OUTPATIENT_CLINIC_OR_DEPARTMENT_OTHER): Payer: Self-pay | Admitting: Emergency Medicine

## 2023-11-24 DIAGNOSIS — E119 Type 2 diabetes mellitus without complications: Secondary | ICD-10-CM | POA: Diagnosis not present

## 2023-11-24 DIAGNOSIS — H1031 Unspecified acute conjunctivitis, right eye: Secondary | ICD-10-CM | POA: Insufficient documentation

## 2023-11-24 DIAGNOSIS — Z79899 Other long term (current) drug therapy: Secondary | ICD-10-CM | POA: Diagnosis not present

## 2023-11-24 DIAGNOSIS — Z7982 Long term (current) use of aspirin: Secondary | ICD-10-CM | POA: Insufficient documentation

## 2023-11-24 DIAGNOSIS — Z720 Tobacco use: Secondary | ICD-10-CM | POA: Insufficient documentation

## 2023-11-24 DIAGNOSIS — Z7984 Long term (current) use of oral hypoglycemic drugs: Secondary | ICD-10-CM | POA: Insufficient documentation

## 2023-11-24 DIAGNOSIS — I10 Essential (primary) hypertension: Secondary | ICD-10-CM | POA: Insufficient documentation

## 2023-11-24 DIAGNOSIS — H5789 Other specified disorders of eye and adnexa: Secondary | ICD-10-CM | POA: Diagnosis present

## 2023-11-24 LAB — RESP PANEL BY RT-PCR (RSV, FLU A&B, COVID)  RVPGX2
Influenza A by PCR: NEGATIVE
Influenza B by PCR: NEGATIVE
Resp Syncytial Virus by PCR: NEGATIVE
SARS Coronavirus 2 by RT PCR: NEGATIVE

## 2023-11-24 MED ORDER — FLUORESCEIN SODIUM 1 MG OP STRP
1.0000 | ORAL_STRIP | Freq: Once | OPHTHALMIC | Status: AC
  Administered 2023-11-24: 1 via OPHTHALMIC
  Filled 2023-11-24: qty 1

## 2023-11-24 MED ORDER — TETRACAINE HCL 0.5 % OP SOLN
2.0000 [drp] | Freq: Once | OPHTHALMIC | Status: AC
  Administered 2023-11-24: 2 [drp] via OPHTHALMIC
  Filled 2023-11-24: qty 4

## 2023-11-24 MED ORDER — ERYTHROMYCIN 5 MG/GM OP OINT
TOPICAL_OINTMENT | Freq: Once | OPHTHALMIC | Status: AC
  Filled 2023-11-24: qty 3.5

## 2023-11-24 MED ORDER — LORATADINE 10 MG PO TABS: 10.0000 mg | ORAL_TABLET | Freq: Every day | ORAL | 0 refills | Status: AC

## 2023-11-24 NOTE — Discharge Instructions (Addendum)
 You were seen in the ER today for evaluation of your eye.  I think this is likely conjunctivitis.  I would like for you to try the erythromycin ointment that was given to you here.  You can use this 4 times a day while awake you can discontinue the use of the drops the urgent care gave you.  I would like for you to pick up a lubricating eyedrop at a local pharmacy and use this in between your erythromycin ointment usage.  Additionally, given information for an ophthalmologist for you to call to follow-up with.  Please call the number included in this discharge paperwork to schedule an appointment.  Additionally, I have included more information in the discharge paperwork for you to review. If you start to have any fever, worsening swelling, eye pain, fever, please return to the nearest emergency department for reevaluation.  If you have any other concerns, new or worsening symptoms, please return to your nearest emergency department for reevaluation. Contact a doctor if: You have a fever. You do not get better after 10 days. Get help right away if: You have a fever and your symptoms get worse all of a sudden. You have very bad pain when you move your eye. Your face: Hurts. Is red. Is swollen. You have sudden loss of vision.

## 2023-11-24 NOTE — ED Triage Notes (Signed)
 Right eye swelling, pain and redness. Seen at [facility] on Monday and dx with pink eye. Has been using abx eye drops with no relief. Also endorses cough.

## 2023-11-24 NOTE — ED Provider Notes (Signed)
 [facility] Provider Note   CSN: [id number] Arrival date & time: 11/24/23  1259     History Chief Complaint  Patient presents with   Eye Problem    Kristine Bell is a 53 y.o. female with history of diabetes, hyperlipidemia, hypertension presents emerged part today for evaluation of itching eyes with discharge.  Patient reports that this started on Saturday.  Her right eye was worse than her left.  She went to urgent care on Monday and was diagnosed with pinkeye and was sent home on some antibiotic drops.  Patient does not know the name of these.  She reports that whenever her symptoms of the cough and cold started with runny nose, nasal ingestion, she started having the watery and crusting eyes.  She denies any fevers or any headaches.  Denies any visual changes.  Denies any trauma or foreign body sensation.  She denies getting any chemicals or substance in her eyes that she knows of.  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  She does not not wear any contacts.  Supposed wear glasses however does not.  She does experience dry eyes often.  She reports that since she was given the drops, she does not feel that this is helping her.  She reports that her eye is still crusting and is goopy.  She does have some occasional blurry vision however when she ever she blinks this does improve.  No pain to the eye but does have some "irritation" and burning.  No pain with movement of the eye.  She does suffer from seasonal allergies as well.  Daily tobacco use.   Eye Problem Associated symptoms: discharge, itching and redness   Associated symptoms: no photophobia        Home Medications Prior to Admission medications   Medication Sig Start Date End Date Taking? Authorizing Provider  albuterol (VENTOLIN HFA) 108 (90 Base) MCG/ACT inhaler Inhale 1-2 puffs into the lungs every 6 (six) hours as needed for wheezing or shortness of breath. 08/30/22    Small, Brooke L, PA  aspirin EC 81 MG tablet Take 1 tablet (81 mg total) by mouth daily. Swallow whole. 05/23/23   Peter Garter, PA  atorvastatin (LIPITOR) 10 MG tablet Take 1 tablet (10 mg total) by mouth daily. 05/23/23   Peter Garter, PA  benzonatate (TESSALON) 100 MG capsule Take 1 capsule (100 mg total) by mouth 3 (three) times daily as needed for cough. 05/23/23   Peter Garter, PA  buPROPion (WELLBUTRIN XL) 150 MG 24 hr tablet 1 tab po q day 11/03/20   Saguier, Ramon Dredge, PA-C  chlorthalidone (HYGROTON) 25 MG tablet Take 1 tablet (25 mg total) by mouth daily. 05/23/23   Peter Garter, PA  cyclobenzaprine (FLEXERIL) 10 MG tablet Take 1 tablet (10 mg total) by mouth 2 (two) times daily as needed for muscle spasms. 10/21/23   Smitty Knudsen, PA-C  HYDROcodone-acetaminophen (NORCO/VICODIN) 5-325 MG tablet Take 1 tablet by mouth every 4 (four) hours as needed. 03/06/23   Jacalyn Lefevre, MD  lidocaine (HM LIDOCAINE PATCH) 4 % Place 2 patches onto the skin daily. 08/30/22   Small, Brooke L, PA  losartan (COZAAR) 50 MG tablet Take 1 tablet (50 mg total) by mouth daily. 05/23/23   Peter Garter, PA  meloxicam (MOBIC) 7.5 MG tablet Take 1 tablet (7.5 mg total) by mouth daily. 06/19/22   Renne Crigler, PA-C  metFORMIN (GLUCOPHAGE) 500 MG  tablet Take 1 tablet (500 mg total) by mouth 2 (two) times daily with a meal. 11/03/20   Saguier, Ramon Dredge, PA-C  methocarbamol (ROBAXIN) 500 MG tablet Take 1 tablet (500 mg total) by mouth 2 (two) times daily as needed for muscle spasms. 03/06/23   Jacalyn Lefevre, MD  Multiple Vitamin (MULTI-VITAMIN) tablet Take 1 tablet by mouth daily.    [provider]  omeprazole (PRILOSEC) 20 MG capsule Take 1 capsule (20 mg total) by mouth daily. 11/03/20   Saguier, Ramon Dredge, PA-C  oseltamivir (TAMIFLU) 75 MG capsule Take 1 capsule (75 mg total) by mouth every 12 (twelve) hours. 08/30/22   Small, Brooke L, PA  oxybutynin (DITROPAN) 5 MG tablet Take 5 mg by mouth 2  (two) times daily. 09/01/20   [provider]  oxyCODONE-acetaminophen (PERCOCET/ROXICET) 5-325 MG tablet Take 1 tablet by mouth every 6 (six) hours as needed for up to 10 doses for severe pain. 11/28/22   Jeanelle Malling, PA  traZODone (DESYREL) 50 MG tablet Take 0.5-1 tablets (25-50 mg total) by mouth at bedtime as needed for sleep. 11/03/20   Saguier, Ramon Dredge, PA-C      Allergies    Naproxen, Naproxen sodium, Aspirin, Aspirin-acetaminophen, Sertraline, and Tramadol    Review of Systems   Review of Systems  Constitutional:  Negative for chills and fever.  HENT:  Positive for congestion and rhinorrhea.   Eyes:  Positive for discharge, redness and itching. Negative for photophobia.    Physical Exam Updated Vital Signs BP (!) 133/104   Pulse 65   Temp 97.7 F (36.5 C)   Resp 16   Ht 5\' 5"  (1.651 m)   Wt (!) 156.5 kg   SpO2 100%   BMI 57.41 kg/m  Physical Exam Vitals and nursing note reviewed.  Constitutional:      General: She is not in acute distress.    Appearance: She is not ill-appearing or toxic-appearing.  HENT:     Right Ear: Tympanic membrane, ear canal and external ear normal.     Left Ear: Tympanic membrane, ear canal and external ear normal.     Mouth/Throat:     Mouth: Mucous membranes are moist.  Eyes:     General: No scleral icterus.    Extraocular Movements: Extraocular movements intact.     Pupils: Pupils are equal, round, and reactive to light.     Comments: Patient does have some erythema seen to the conjunctiva on the right side.  Slightly injected.  There is some mucopurulence present in the medial canthus.  She does have some crusting present on the upper and lower eyelashes.  Very minimal swelling to the upper and lower eyelid.  PERRLA.  EOMI.  On lid eversion, no foreign bodies noted.  Very thin overlying mucopurulent film to the eye.  On fluorescein examination, no corneal ulcerations or abrasions seen.  Negative Seidel sign.  No dendritic lesions.  She  has no pain with movement of her eyes.  Pulmonary:     Effort: Pulmonary effort is normal. No respiratory distress.  Skin:    General: Skin is warm and dry.  Neurological:     Mental Status: She is alert.     ED Results / Procedures / Treatments   Labs (all labs ordered are listed, but only abnormal results are displayed) Labs Reviewed  RESP PANEL BY RT-PCR (RSV, FLU A&B, COVID)  [id number]    EKG None  Radiology No results found.  Procedures Procedures   Medications Ordered in  ED Medications  fluorescein ophthalmic strip 1 strip (1 strip Right Eye Given 11/24/23 1659)  tetracaine (PONTOCAINE) 0.5 % ophthalmic solution 2 drop (2 drops Right Eye Given by Other 11/24/23 1700)  erythromycin ophthalmic ointment ( Right Eye Given 11/24/23 1752)    ED Course/ Medical Decision Making/ A&P                                Medical Decision Making Risk OTC drugs. Prescription drug management.   53 y.o. female presents to the ER for evaluation of left eye discharge and crusting/burning. Differential diagnosis includes but is not limited to ocular ischemia, orbital cellulitis, temporal arteritis, acute angle closure glaucoma, eye trauma, uveitis, iritis, corneal abrasion/ulceration.. Vital signs show mildly elevated blood pressure otherwise unremarkable. Physical exam as noted above.   I independently reviewed and interpreted the patient's labs.  Negative for COVID, flu, RSV.  Fluorescein exam is unremarkable.  Patient does have some mucopurulent discharge at the medial canthus with some crusting on the eyelashes.  There is some overlying film of the eye however is removable with Q-tip.  Patient reports that the burning has subsided and she feels much better after the tetracaine drops.  I do not appreciate any Seidel sign, dendritic lesion, corneal ulceration or abrasion.  Could be having some bacterial conjunctivitis or viral conjunctivitis.  Did not have any help with the eyedrops.   Unsure which eyedrops he is worse patient not bring the bottle with her and does not have any records.  Will give her some erythromycin ointment and recommend lubricating drops to see if this helps.  I have also recommended that she follow-up with the ophthalmologist to call to schedule an appointment as soon as possible.  She is having some blurry vision but whenever she blinks it does improve, this is likely from the film from the mucopurulent discharge.  She has no tenderness to her temple.  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  She reports that she did have some cough and cold symptoms prior but is felt these have improved.  The symptoms started whenever her eye started to crust.  She denies any visual changes or pain in the eye.  Reports it is more of an irritated burning sensation.  She has no pain with movement of her eye.  Has some very minimal swelling to the upper and lower lid.  I doubt any orbital cellulitis.  She is not having any pain or vision loss.  Doubt any detachment or ischemia.  Nontender to the temples, doubt any temporal arteritis.  She has no contralateral photophobia.  Pupils are equal round and reactive to light.  Erythromycin ointment given here and she was sent home with the tube.  Again, she is stable for discharge home with close outpatient follow-up and strict return precautions.   We discussed the results of the labs/imaging. The plan is erythromycin ointment, lubricating eyedrops, follow-up with ophthalmology. We discussed strict return precautions and red flag symptoms. The patient verbalized their understanding and agrees to the plan. The patient is stable and being discharged home in good condition.  Portions of this report may have been transcribed using voice recognition software. Every effort was made to ensure accuracy; however, inadvertent computerized transcription errors may  be present.    Final Clinical Impression(s) / ED Diagnoses Final  diagnoses:  Acute conjunctivitis of right eye, unspecified acute conjunctivitis type    Rx / DC Orders ED Discharge Orders          Ordered    loratadine (CLARITIN) 10 MG tablet  Daily        11/24/23 1841              Achille Rich, PA-C 11/26/23 1644    Vanetta Mulders, MD 11/27/23 [phone]

## 2024-01-02 ENCOUNTER — Other Ambulatory Visit: Payer: Self-pay

## 2024-01-02 ENCOUNTER — Emergency Department (HOSPITAL_BASED_OUTPATIENT_CLINIC_OR_DEPARTMENT_OTHER)
Admission: EM | Admit: 2024-01-02 | Discharge: 2024-01-02 | Disposition: A | Discharge status: Home / Self Care | Attending: Emergency Medicine | Admitting: Emergency Medicine

## 2024-01-02 ENCOUNTER — Encounter (HOSPITAL_BASED_OUTPATIENT_CLINIC_OR_DEPARTMENT_OTHER): Payer: Self-pay | Admitting: Emergency Medicine

## 2024-01-02 DIAGNOSIS — K047 Periapical abscess without sinus: Secondary | ICD-10-CM | POA: Insufficient documentation

## 2024-01-02 DIAGNOSIS — F17210 Nicotine dependence, cigarettes, uncomplicated: Secondary | ICD-10-CM | POA: Diagnosis not present

## 2024-01-02 DIAGNOSIS — R519 Headache, unspecified: Secondary | ICD-10-CM | POA: Diagnosis present

## 2024-01-02 DIAGNOSIS — Z7984 Long term (current) use of oral hypoglycemic drugs: Secondary | ICD-10-CM | POA: Insufficient documentation

## 2024-01-02 DIAGNOSIS — E119 Type 2 diabetes mellitus without complications: Secondary | ICD-10-CM | POA: Diagnosis not present

## 2024-01-02 DIAGNOSIS — I10 Essential (primary) hypertension: Secondary | ICD-10-CM | POA: Diagnosis not present

## 2024-01-02 DIAGNOSIS — Z7982 Long term (current) use of aspirin: Secondary | ICD-10-CM | POA: Insufficient documentation

## 2024-01-02 DIAGNOSIS — Z79899 Other long term (current) drug therapy: Secondary | ICD-10-CM | POA: Diagnosis not present

## 2024-01-02 MED ORDER — OXYCODONE HCL 5 MG PO TABS: 5.0000 mg | ORAL_TABLET | Freq: Four times a day (QID) | ORAL | 0 refills | Status: DC | PRN

## 2024-01-02 MED ORDER — AMOXICILLIN 500 MG PO CAPS: 500.0000 mg | ORAL_CAPSULE | Freq: Three times a day (TID) | ORAL | 0 refills | Status: AC

## 2024-01-02 MED ORDER — KETOROLAC TROMETHAMINE 15 MG/ML IJ SOLN
15.0000 mg | Freq: Once | INTRAMUSCULAR | Status: AC
  Administered 2024-01-02: 15 mg via INTRAMUSCULAR
  Filled 2024-01-02: qty 1

## 2024-01-02 NOTE — Discharge Instructions (Signed)
 We evaluated you for your dental pain.  Your examination suggests that you have a dental infection.  Please follow-up with the dentist.  We have attached a guide of local dentists.  We have prescribed you antibiotics.  Please take this as prescribed.  Please take Tylenol  (acetaminophen ) and Motrin (ibuprofen) for your symptoms at home.  You can take 1000 mg of Tylenol  every 6 hours and 600 mg of Motrin every 6 hours as needed for your symptoms.  You can take these medicines together as needed, either at the same time, or alternating every 3 hours.  We have prescribed you small amount of oxycodone .  Please try Tylenol  Motrin first, if your pain is persistent you can take oxycodone  as well.  Do not mix this medication with alcohol or drive while taking it.  Please return to the emergency department for any new symptoms such as worsening swelling, difficulty swallowing, difficulty breathing, fevers, or any other concerning symptoms.

## 2024-01-02 NOTE — ED Provider Notes (Signed)
 [place] [facility] Provider Note  CSN: [id number] Arrival date & time: 01/02/24 1914  Chief Complaint(s) Facial Pain  HPI Kristine Bell is a 53 y.o. female history of diabetes, hypertension, hyperlipidemia presenting to the emergency department with left facial pain.  Reports symptoms began 2 days ago.  Reports tooth ache, cold sensitivity.  Also pain radiates to the ear.  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   Hydronephrosis of left kidney 08/11/2015   Ureteral calculus, left 08/11/2015   Panic attack 03/09/2015   Pelvic pain in female 03/09/2015   Polyuria 03/09/2015   Primary insomnia 03/09/2015   Closed fracture of lateral malleolus 01/20/2015   Sprain of left ankle 01/20/2015   Chronic pain syndrome 12/02/2014   Atypical chest pain 11/11/2014   Hypercalcemia 11/11/2014   Bilateral carpal tunnel syndrome 10/23/2014   Encounter for monitoring opioid maintenance therapy 10/02/2014   Chronic constipation 07/15/2014   Diabetes mellitus type 2, controlled, without complications (HCC) 07/15/2014   Hypokalemia 07/15/2014   Polycystic ovarian syndrome 07/15/2014   Morbid obesity (HCC) 05/15/2014   Ovarian cyst 03/17/2014   Allergic rhinitis 12/31/2013   Hyposmolality and/or hyponatremia 12/31/2013   Iron deficiency anemia 12/31/2013   Leiomyoma of uterus 12/31/2013   Tobacco use disorder 12/31/2013   Urinary incontinence 12/31/2013   Excessive and frequent menstruation  09/18/2013   Malaise and fatigue 09/18/2013   Metrorrhagia 09/18/2013   Symptom associated with female genital organs 09/18/2013   Exostosis 08/26/2013   Essential hypertension 05/27/2013   Pain in joint involving lower leg 05/27/2013   Osteoarthritis of knee 05/27/2013   Cholelithiasis 03/11/2013   Edema 03/11/2013   Esophageal reflux 03/11/2013   Neck pain 03/11/2013   Backache 03/11/2013   Depressive disorder 02/16/2013   Dizziness 02/16/2013   Dysphagia 02/16/2013   Hypercholesterolemia 02/16/2013   Migraine 02/16/2013   Pain in thoracic spine 02/16/2013   Primary localized osteoarthrosis, lower leg 02/16/2013   Spasm of muscle 02/16/2013   Ventral hernia 02/16/2013   Home Medication(s) Prior to Admission medications   Medication Sig Start Date End Date Taking? Authorizing Provider  amoxicillin  (AMOXIL ) 500 MG capsule Take 1 capsule (500 mg total) by mouth 3 (three) times daily. 01/02/24  Yes Mordecai Applebaum, MD  oxyCODONE  (ROXICODONE ) 5 MG immediate release tablet Take 1 tablet (5 mg total) by mouth every 6 (six) hours as needed for breakthrough pain. 01/02/24  Yes Mordecai Applebaum, MD  albuterol  (VENTOLIN  HFA) 108 (90 Base) MCG/ACT inhaler Inhale 1-2 puffs into the lungs every 6 (six) hours as needed for wheezing or shortness of breath. 08/30/22   Small, Brooke L, PA  aspirin  EC 81 MG tablet Take 1 tablet (81 mg total) by mouth daily. Swallow whole. 05/23/23   [place] Butter, PA  atorvastatin  (LIPITOR) 10 MG tablet Take 1 tablet (10 mg total) by mouth daily. 05/23/23    Butter, PA  benzonatate  (TESSALON ) 100 MG capsule Take 1 capsule (100 mg total)  by mouth 3 (three) times daily as needed for cough. 05/23/23   [place] Butter, PA  buPROPion  (WELLBUTRIN  XL) 150 MG 24 hr tablet 1 tab po q day 11/03/20   Saguier, Gaylin Ke, PA-C  chlorthalidone  (HYGROTON ) 25 MG tablet Take 1 tablet (25 mg total) by mouth daily. 05/23/23   [place] Butter, PA  cyclobenzaprine   (FLEXERIL ) 10 MG tablet Take 1 tablet (10 mg total) by mouth 2 (two) times daily as needed for muscle spasms. 10/21/23   Zelaya, Oscar A, PA-C  lidocaine  (HM LIDOCAINE  PATCH) 4 % Place 2 patches onto the skin daily. 08/30/22   Small, Brooke L, PA  loratadine  (CLARITIN ) 10 MG tablet Take 1 tablet (10 mg total) by mouth daily. 11/24/23   Spence Dux, PA-C  losartan  (COZAAR ) 50 MG tablet Take 1 tablet (50 mg total) by mouth daily. 05/23/23   [place] Butter, PA  meloxicam  (MOBIC ) 7.5 MG tablet Take 1 tablet (7.5 mg total) by mouth daily. 06/19/22   Geiple, Joshua, PA-C  metFORMIN  (GLUCOPHAGE ) 500 MG tablet Take 1 tablet (500 mg total) by mouth 2 (two) times daily with a meal. 11/03/20   Saguier, Gaylin Ke, PA-C  methocarbamol  (ROBAXIN ) 500 MG tablet Take 1 tablet (500 mg total) by mouth 2 (two) times daily as needed for muscle spasms. 03/06/23   Sueellen Emery, MD  Multiple Vitamin (MULTI-VITAMIN) tablet Take 1 tablet by mouth daily.    [provider]  omeprazole  (PRILOSEC) 20 MG capsule Take 1 capsule (20 mg total) by mouth daily. 11/03/20   Saguier, Gaylin Ke, PA-C  oseltamivir  (TAMIFLU ) 75 MG capsule Take 1 capsule (75 mg total) by mouth every 12 (twelve) hours. 08/30/22   Small, Brooke L, PA  oxybutynin (DITROPAN) 5 MG tablet Take 5 mg by mouth 2 (two) times daily. 09/01/20   [provider]  traZODone  (DESYREL ) 50 MG tablet Take 0.5-1 tablets (25-50 mg total) by mouth at bedtime as needed for sleep. 11/03/20   Saguier, Slater Duncan                                                                                                                                    Past Surgical History Past Surgical History:  Procedure Laterality Date   ABLATION ON ENDOMETRIOSIS     HERNIA REPAIR     LITHOTRIPSY     TUBAL LIGATION     Family History Family History  Problem Relation Age of Onset   Hyperlipidemia Father    Hypertension Father    Hypertension Brother    Cervical cancer Maternal  Grandmother    Crohn's disease Brother     Social History Social History   Tobacco Use   Smoking status: Every Day    Current packs/day: 0.50    Types: Cigarettes   Smokeless tobacco: Never  Vaping Use   Vaping status: Never Used  Substance Use Topics   Alcohol use: Yes  Comment: occ   Drug use: No   Allergies Naproxen, Naproxen sodium, Aspirin , Aspirin -acetaminophen , Sertraline, and Tramadol  Review of Systems Review of Systems  All other systems reviewed and are negative.   Physical Exam Vital Signs  I have reviewed the triage vital signs BP (!) 140/67   Pulse 83   Temp 98.1 F (36.7 C) (Oral)   Resp 18   Ht 5\' 5"  (1.651 m)   Wt (!) 155.6 kg   SpO2 99%   BMI 57.08 kg/m  Physical Exam Vitals and nursing note reviewed.  Constitutional:      Appearance: Normal appearance.  HENT:     Head: Normocephalic and atraumatic.     Left Ear: There is impacted cerumen.     Mouth/Throat:     Mouth: Mucous membranes are moist.     Comments: Poor dentition throughout.  No facial swelling.  Focal tenderness to percussion of fractured tooth in the left lower jaw.  No periapical abscess.  Floor of mouth soft.  Uvula midline. Eyes:     Conjunctiva/sclera: Conjunctivae normal.  Cardiovascular:     Rate and Rhythm: Normal rate.  Pulmonary:     Effort: Pulmonary effort is normal. No respiratory distress.  Abdominal:     General: Abdomen is flat.  Musculoskeletal:        General: No deformity.  Skin:    General: Skin is warm and dry.     Capillary Refill: Capillary refill takes less than 2 seconds.  Neurological:     General: No focal deficit present.     Mental Status: She is alert. Mental status is at baseline.  Psychiatric:        Mood and Affect: Mood normal.        Behavior: Behavior normal.     ED Results and Treatments Labs (all labs ordered are listed, but only abnormal results are displayed) Labs Reviewed - No data to display                                                                                                                         Radiology No results found.  Pertinent labs & imaging results that were available during my care of the patient were reviewed by me and considered in my medical decision making (see MDM for details).  Medications Ordered in ED Medications  ketorolac  (TORADOL ) 15 MG/ML injection 15 mg (has no administration in time range)  Procedures Procedures  (including critical care time)  Medical Decision Making / ED Course   MDM:  53 year old presenting to the emergency department with facial pain.  Suspect early dental infection.  No sign of abscess.  Will prescribe antibiotics.  Discussed self-care at home.  Patient request additional pain control will give very small amount of oxycodone , discussed safe use of this medication.  Discussed need for follow-up with dentist.  Patient reports radiation to the ear.  Exam with EAC, TM not visualized due to wax but seems much more consistent with a dental infection.  Will discharge patient to home. All questions answered. Patient comfortable with plan of discharge. Return precautions discussed with patient and specified on the after visit summary.        Medicines ordered and prescription drug management: Meds ordered this encounter  Medications   ketorolac  (TORADOL ) 15 MG/ML injection 15 mg   amoxicillin  (AMOXIL ) 500 MG capsule    Sig: Take 1 capsule (500 mg total) by mouth 3 (three) times daily.    Dispense:  21 capsule    Refill:  0   oxyCODONE  (ROXICODONE ) 5 MG immediate release tablet    Sig: Take 1 tablet (5 mg total) by mouth every 6 (six) hours as needed for breakthrough pain.    Dispense:  5 tablet    Refill:  0    -I have reviewed the patients home medicines and have made adjustments as needed   Social  Determinants of Health:  Diagnosis or treatment significantly limited by social determinants of health: obesity   Reevaluation: After the interventions noted above, I reevaluated the patient and found that their symptoms have improved  Co morbidities that complicate the patient evaluation  Past Medical History:  Diagnosis Date   Diabetes mellitus without complication (HCC)    GERD (gastroesophageal reflux disease)    Hyperlipidemia    Hypertension    Kidney stone    Kidney stones    Obesity       Dispostion: Disposition decision including need for hospitalization was considered, and patient discharged from emergency department.    Final Clinical Impression(s) / ED Diagnoses Final diagnoses:  Dental infection     This chart was dictated using voice recognition software.  Despite best efforts to proofread,  errors can occur which can change the documentation meaning.    Mordecai Applebaum, MD 01/02/24 1004

## 2024-01-02 NOTE — ED Triage Notes (Addendum)
 2 DAY HX OF LEFT FACIAL PAIN, has a bad tooth on that side and her ear hurts very sensitive to cold on that side of mouth

## 2024-04-20 ENCOUNTER — Encounter (HOSPITAL_BASED_OUTPATIENT_CLINIC_OR_DEPARTMENT_OTHER): Payer: Self-pay | Admitting: Emergency Medicine

## 2024-04-20 ENCOUNTER — Emergency Department (HOSPITAL_BASED_OUTPATIENT_CLINIC_OR_DEPARTMENT_OTHER)
Admission: EM | Admit: 2024-04-20 | Discharge: 2024-04-20 | Disposition: A | Discharge status: Home / Self Care | Attending: Emergency Medicine | Admitting: Emergency Medicine

## 2024-04-20 ENCOUNTER — Other Ambulatory Visit: Payer: Self-pay

## 2024-04-20 DIAGNOSIS — I10 Essential (primary) hypertension: Secondary | ICD-10-CM | POA: Diagnosis not present

## 2024-04-20 DIAGNOSIS — Z7982 Long term (current) use of aspirin: Secondary | ICD-10-CM | POA: Insufficient documentation

## 2024-04-20 DIAGNOSIS — Z79899 Other long term (current) drug therapy: Secondary | ICD-10-CM | POA: Diagnosis not present

## 2024-04-20 DIAGNOSIS — Z760 Encounter for issue of repeat prescription: Secondary | ICD-10-CM | POA: Insufficient documentation

## 2024-04-20 DIAGNOSIS — H5789 Other specified disorders of eye and adnexa: Secondary | ICD-10-CM | POA: Diagnosis present

## 2024-04-20 DIAGNOSIS — H1032 Unspecified acute conjunctivitis, left eye: Secondary | ICD-10-CM | POA: Diagnosis not present

## 2024-04-20 MED ORDER — CHLORTHALIDONE 25 MG PO TABS: 25.0000 mg | ORAL_TABLET | Freq: Every day | ORAL | 0 refills | Status: AC

## 2024-04-20 MED ORDER — POLYMYXIN B-TRIMETHOPRIM 10000-0.1 UNIT/ML-% OP SOLN: 1.0000 [drp] | Freq: Four times a day (QID) | OPHTHALMIC | 0 refills | Status: AC

## 2024-04-20 MED ORDER — ATORVASTATIN CALCIUM 10 MG PO TABS: 10.0000 mg | ORAL_TABLET | Freq: Every day | ORAL | 0 refills | Status: AC

## 2024-04-20 MED ORDER — LOSARTAN POTASSIUM 50 MG PO TABS: 50.0000 mg | ORAL_TABLET | Freq: Every day | ORAL | 0 refills | Status: AC

## 2024-04-20 NOTE — ED Provider Notes (Signed)
 [place] [facility] Provider Note   CSN: [id number] Arrival date & time: 04/20/24  9064     Patient presents with: Conjunctivitis   Kristine Bell is a 53 y.o. female patient who presents to the emergency department today for further evaluation of left eye drainage and itchiness that started yesterday.  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  The students were very excited to see her and she was giving a lot of fist bumps.  She thinks that she might of contracted from one of the students.  She cannot recall a particular student that had any similar symptoms.  Patient also requests medication refill for her hyperlipidemia medications and hypertension medications.  She is still between primary care doctors.  She denies any other symptoms including blurred vision, vision loss.  No significant eye pain.    Conjunctivitis       Prior to Admission medications   Medication Sig Start Date End Date Taking? Authorizing Provider  atorvastatin  (LIPITOR) 10 MG tablet Take 1 tablet (10 mg total) by mouth daily. 04/20/24  Yes Theotis, Louanne Calvillo M, PA-C  chlorthalidone  (HYGROTON ) 25 MG tablet Take 1 tablet (25 mg total) by mouth daily. 04/20/24  Yes Tamiyah Moulin M, PA-C  losartan  (COZAAR ) 50 MG tablet Take 1 tablet (50 mg total) by mouth daily. 04/20/24  Yes Theotis, Vance Hochmuth M, PA-C  trimethoprim -polymyxin b  (POLYTRIM ) ophthalmic solution Place 1 drop into both eyes every 6 (six) hours for 7 days. 04/20/24 04/27/24 Yes Cleona Doubleday M, PA-C  albuterol  (VENTOLIN  HFA) 108 (90 Base) MCG/ACT inhaler Inhale 1-2 puffs into the lungs every 6 (six) hours as needed for wheezing or shortness of breath. 08/30/22   Small, Brooke L, PA  amoxicillin  (AMOXIL ) 500 MG capsule Take 1 capsule (500 mg total) by mouth 3 (three) times daily. 01/02/24   Francesca Elsie CROME, MD  aspirin  EC 81 MG tablet Take 1 tablet (81 mg total) by mouth daily. Swallow whole. 05/23/23   [place] LABOR, PA  atorvastatin  (LIPITOR) 10 MG tablet Take 1 tablet (10 mg total) by mouth daily. 05/23/23   [place] LABOR, PA  benzonatate  (TESSALON ) 100 MG capsule Take 1 capsule (100 mg total) by mouth 3 (three) times daily as needed for cough. 05/23/23   [place] LABOR, PA  buPROPion  (WELLBUTRIN  XL) 150 MG 24 hr tablet 1 tab po q day 11/03/20   Saguier, [place], PA-C  chlorthalidone  (HYGROTON ) 25 MG tablet Take 1 tablet (25 mg total) by mouth daily. 05/23/23   [place] LABOR, PA  cyclobenzaprine  (FLEXERIL ) 10 MG tablet Take 1 tablet (10 mg total) by mouth 2 (two) times daily as needed for muscle spasms. 10/21/23   Zelaya, Oscar A, PA-C  lidocaine  (HM LIDOCAINE  PATCH) 4 % Place 2 patches onto the skin daily. 08/30/22   Small, Brooke L, PA  loratadine  (CLARITIN ) 10 MG tablet Take 1 tablet (10 mg total) by mouth daily. 11/24/23   Bernis Ernst, PA-C  losartan  (COZAAR ) 50 MG tablet Take 1 tablet (50 mg total) by mouth daily. 05/23/23   [place] LABOR, PA  meloxicam  (MOBIC ) 7.5 MG tablet Take 1 tablet (7.5 mg total) by mouth daily. 06/19/22   Geiple, Joshua, PA-C  metFORMIN  (GLUCOPHAGE ) 500 MG tablet Take 1 tablet (500 mg total) by mouth 2 (two) times daily with a meal. 11/03/20   Saguier, [place], PA-C  methocarbamol  (ROBAXIN ) 500 MG tablet Take 1 tablet (500 mg total) by  mouth 2 (two) times daily as needed for muscle spasms. 03/06/23   Dean Clarity, MD  Multiple Vitamin (MULTI-VITAMIN) tablet Take 1 tablet by mouth daily.    [provider]  omeprazole  (PRILOSEC) 20 MG capsule Take 1 capsule (20 mg total) by mouth daily. 11/03/20   Saguier, [place], PA-C  oseltamivir  (TAMIFLU ) 75 MG capsule Take 1 capsule (75 mg total) by mouth every 12 (twelve) hours. 08/30/22   Small, Brooke L, PA  oxybutynin (DITROPAN) 5 MG tablet Take 5 mg by mouth 2 (two) times daily. 09/01/20   [provider]  oxyCODONE  (ROXICODONE ) 5 MG immediate release tablet Take 1 tablet (5 mg total) by mouth every 6 (six)  hours as needed for breakthrough pain. 01/02/24   Francesca Elsie CROME, MD  traZODone  (DESYREL ) 50 MG tablet Take 0.5-1 tablets (25-50 mg total) by mouth at bedtime as needed for sleep. 11/03/20   Saguier, [place], PA-C    Allergies: Naproxen, Naproxen sodium, Aspirin , Aspirin -acetaminophen , Sertraline, and Tramadol    Review of Systems  All other systems reviewed and are negative.   Updated Vital Signs BP (!) 176/70 (BP Location: Right Arm)   Pulse 74   Temp 98.1 F (36.7 C) (Oral)   Resp 16   Ht 5' 5 (1.651 m)   Wt (!) 147.4 kg   SpO2 98%   BMI 54.08 kg/m   Physical Exam Vitals and nursing note reviewed.  Constitutional:      Appearance: Normal appearance.  HENT:     Head: Normocephalic and atraumatic.  Eyes:     General:        Right eye: No discharge.        Left eye: Discharge present.    Conjunctiva/sclera: Conjunctivae normal.     Comments: Left conjunctival injection with fair amount of tearing and some purulent drainage.  Pulmonary:     Effort: Pulmonary effort is normal.  Skin:    General: Skin is warm and dry.     Findings: No rash.  Neurological:     General: No focal deficit present.     Mental Status: She is alert.  Psychiatric:        Mood and Affect: Mood normal.        Behavior: Behavior normal.     (all labs ordered are listed, but only abnormal results are displayed) Labs Reviewed - No data to display  EKG: None  Radiology: No results found.   Procedures   Medications Ordered in the ED - No data to display   Medical Decision Making Kristine Bell is a 53 y.o. female patient who presents to the emergency department today for further evaluation of left eye drainage.  This is consistent with a conjunctivitis.  Will treat with Polytrim .  Patient does not wear contacts.  I also refilled her hypertensive medications and hyperlipidemia medications.  I given her a 30-day supply.  I have also given her follow-up with [place] [facility] here at  [facility].  She will call and schedule appointment.  Strict turn precautions were discussed.  Patient is safe for discharge at this time.   Risk Prescription drug management.     Final diagnoses:  Acute conjunctivitis of left eye, unspecified acute conjunctivitis type    ED Discharge Orders          Ordered    trimethoprim -polymyxin b  (POLYTRIM ) ophthalmic solution  Every 6 hours        04/20/24 1022    atorvastatin  (  LIPITOR) 10 MG tablet  Daily        04/20/24 1027    losartan  (COZAAR ) 50 MG tablet  Daily        04/20/24 1027    chlorthalidone  (HYGROTON ) 25 MG tablet  Daily        04/20/24 [address] [place], [place] 04/20/24 1033    Doretha Folks, MD 04/24/24 [id number]

## 2024-04-20 NOTE — ED Notes (Signed)
 L eye is obviously irritated and swollen. Itching and drainage for 4 days now. Visene did not help.

## 2024-04-20 NOTE — Discharge Instructions (Addendum)
 As we discussed, I will treat you for bacterial conjunctivitis AKA pinkeye.  I have sent the 3 prescriptions like we discussed to your pharmacy.  I have given you enough for 30 days.  I have also given you follow-up with primary care service here.  You can call and schedule an appointment.  I have also sent the antibiotics to your pharmacy.  Please take as prescribed.  You may return to the emergency department for any worsening symptoms.

## 2024-04-20 NOTE — ED Notes (Signed)
 Discharge paperwork reviewed entirely with patient, including follow up care. Pain was under control. The patient received instruction and coaching on their prescriptions, and all follow-up questions were answered.  Pt verbalized understanding as well as all parties involved. No questions or concerns voiced at the time of discharge. No acute distress noted. Pt was encouraged to stay adequately hydrated and eat a healthy diet.   Pt ambulated out to PVA without incident or assistance.  Pt was given information to obtain and notify a PCP.   The pt was instructed to set up and/or review MyChart for their results; and was informed their Providers all have access to the information as well.

## 2024-04-20 NOTE — ED Triage Notes (Addendum)
 States is having runny eyes, itching, swelling x 3 days . Also out of meds x 1 week, no PCP currently

## 2024-04-24 ENCOUNTER — Encounter (HOSPITAL_BASED_OUTPATIENT_CLINIC_OR_DEPARTMENT_OTHER): Payer: Self-pay

## 2024-04-24 ENCOUNTER — Emergency Department (HOSPITAL_BASED_OUTPATIENT_CLINIC_OR_DEPARTMENT_OTHER)
Admission: EM | Admit: 2024-04-24 | Discharge: 2024-04-24 | Disposition: A | Discharge status: Home / Self Care | Attending: Emergency Medicine | Admitting: Emergency Medicine

## 2024-04-24 ENCOUNTER — Other Ambulatory Visit: Payer: Self-pay

## 2024-04-24 DIAGNOSIS — H109 Unspecified conjunctivitis: Secondary | ICD-10-CM | POA: Diagnosis present

## 2024-04-24 DIAGNOSIS — Z7982 Long term (current) use of aspirin: Secondary | ICD-10-CM | POA: Insufficient documentation

## 2024-04-24 MED ORDER — ERYTHROMYCIN 5 MG/GM OP OINT: TOPICAL_OINTMENT | OPHTHALMIC | 0 refills | Status: AC

## 2024-04-24 MED ORDER — FLUORESCEIN SODIUM 1 MG OP STRP
1.0000 | ORAL_STRIP | Freq: Once | OPHTHALMIC | Status: AC
  Administered 2024-04-24 (×2): 1 via OPHTHALMIC
  Filled 2024-04-24: qty 1

## 2024-04-24 MED ORDER — TETRACAINE HCL 0.5 % OP SOLN
2.0000 [drp] | Freq: Once | OPHTHALMIC | Status: AC
  Administered 2024-04-24 (×2): 2 [drp] via OPHTHALMIC
  Filled 2024-04-24: qty 4

## 2024-04-24 NOTE — ED Notes (Signed)
 Pt alert and oriented X 4 at the time of discharge. RR even and unlabored. No acute distress noted. Pt verbalized understanding of discharge instructions as discussed. Pt ambulatory to lobby at time of discharge.

## 2024-04-24 NOTE — ED Provider Notes (Signed)
 [place] [facility] Provider Note   CSN: [id number] Arrival date & time: 04/24/24  9165     Patient presents with: Conjunctivitis   Kristine Bell is a 53 y.o. female.   HPI    53 year old female presenting to the emergency department with left eye purulence and redness.  She was diagnosed with bacterial conjunctivitis and has been taking the Polytrim  drops without significant relief.  She is not sexually active and is not concerned for sexually transmitted infections.  She has been doing warm compresses.  She states that she has had persistent symptoms.  She endorses mild pain, denies any significant foreign body sensation or injury to her eye.   Prior to Admission medications   Medication Sig Start Date End Date Taking? Authorizing Provider  erythromycin  ophthalmic ointment Place a 1/2 inch ribbon of ointment into the lower eyelid for 5 days 04/24/24  Yes Jerrol Agent, MD  albuterol  (VENTOLIN  HFA) 108 (90 Base) MCG/ACT inhaler Inhale 1-2 puffs into the lungs every 6 (six) hours as needed for wheezing or shortness of breath. 08/30/22   Small, Brooke L, PA  amoxicillin  (AMOXIL ) 500 MG capsule Take 1 capsule (500 mg total) by mouth 3 (three) times daily. 01/02/24   Francesca Elsie CROME, MD  aspirin  EC 81 MG tablet Take 1 tablet (81 mg total) by mouth daily. Swallow whole. 05/23/23   Silver Wonda LABOR, PA  atorvastatin  (LIPITOR) 10 MG tablet Take 1 tablet (10 mg total) by mouth daily. 05/23/23   Silver Wonda LABOR, PA  atorvastatin  (LIPITOR) 10 MG tablet Take 1 tablet (10 mg total) by mouth daily. 04/20/24   Theotis Peers M, PA-C  benzonatate  (TESSALON ) 100 MG capsule Take 1 capsule (100 mg total) by mouth 3 (three) times daily as needed for cough. 05/23/23   Silver Wonda LABOR, PA  buPROPion  (WELLBUTRIN  XL) 150 MG 24 hr tablet 1 tab po q day 11/03/20   Saguier, [place], PA-C  chlorthalidone  (HYGROTON ) 25 MG tablet Take 1 tablet (25 mg total) by mouth daily.  05/23/23   Silver Wonda LABOR, PA  chlorthalidone  (HYGROTON ) 25 MG tablet Take 1 tablet (25 mg total) by mouth daily. 04/20/24   Theotis Peers M, PA-C  cyclobenzaprine  (FLEXERIL ) 10 MG tablet Take 1 tablet (10 mg total) by mouth 2 (two) times daily as needed for muscle spasms. 10/21/23   Zelaya, Oscar A, PA-C  lidocaine  (HM LIDOCAINE  PATCH) 4 % Place 2 patches onto the skin daily. 08/30/22   Small, Brooke L, PA  loratadine  (CLARITIN ) 10 MG tablet Take 1 tablet (10 mg total) by mouth daily. 11/24/23   Bernis Ernst, PA-C  losartan  (COZAAR ) 50 MG tablet Take 1 tablet (50 mg total) by mouth daily. 05/23/23   Silver Wonda LABOR, PA  losartan  (COZAAR ) 50 MG tablet Take 1 tablet (50 mg total) by mouth daily. 04/20/24   Theotis Peers M, PA-C  meloxicam  (MOBIC ) 7.5 MG tablet Take 1 tablet (7.5 mg total) by mouth daily. 06/19/22   Geiple, Joshua, PA-C  metFORMIN  (GLUCOPHAGE ) 500 MG tablet Take 1 tablet (500 mg total) by mouth 2 (two) times daily with a meal. 11/03/20   Saguier, [place], PA-C  methocarbamol  (ROBAXIN ) 500 MG tablet Take 1 tablet (500 mg total) by mouth 2 (two) times daily as needed for muscle spasms. 03/06/23   Dean Clarity, MD  Multiple Vitamin (MULTI-VITAMIN) tablet Take 1 tablet by mouth daily.    [provider]  omeprazole  (PRILOSEC) 20 MG capsule Take 1  capsule (20 mg total) by mouth daily. 11/03/20   Saguier, [place], PA-C  oseltamivir  (TAMIFLU ) 75 MG capsule Take 1 capsule (75 mg total) by mouth every 12 (twelve) hours. 08/30/22   Small, Brooke L, PA  oxybutynin (DITROPAN) 5 MG tablet Take 5 mg by mouth 2 (two) times daily. 09/01/20   [provider]  oxyCODONE  (ROXICODONE ) 5 MG immediate release tablet Take 1 tablet (5 mg total) by mouth every 6 (six) hours as needed for breakthrough pain. 01/02/24   Francesca Elsie CROME, MD  traZODone  (DESYREL ) 50 MG tablet Take 0.5-1 tablets (25-50 mg total) by mouth at bedtime as needed for sleep. 11/03/20   Saguier, [place], PA-C   trimethoprim -polymyxin b  (POLYTRIM ) ophthalmic solution Place 1 drop into both eyes every 6 (six) hours for 7 days. 04/20/24 04/27/24  Theotis Cameron HERO, PA-C    Allergies: Naproxen, Naproxen sodium, Aspirin , Aspirin -acetaminophen , Sertraline, and Tramadol    Review of Systems  All other systems reviewed and are negative.   Updated Vital Signs BP (!) 140/79 (BP Location: Right Arm)   Pulse 89   Temp 98.2 F (36.8 C)   Resp 17   SpO2 100%   Physical Exam Vitals and nursing note reviewed.  Constitutional:      General: She is not in acute distress. HENT:     Head: Normocephalic and atraumatic.  Eyes:     Conjunctiva/sclera:     Left eye: Left conjunctiva is injected. Chemosis and exudate present.     Pupils: Pupils are equal, round, and reactive to light.     Left eye: No corneal abrasion or fluorescein  uptake. Seidel exam negative.    Slit lamp exam:    Left eye: No corneal ulcer or foreign body.     Comments: Purulent drainage/exudate from the left eye noted, no surrounding erythema periorbitally.  No fluorescein  stain uptake, no visualized foreign body  Cardiovascular:     Rate and Rhythm: Normal rate and regular rhythm.  Pulmonary:     Effort: Pulmonary effort is normal. No respiratory distress.  Abdominal:     General: There is no distension.     Tenderness: There is no guarding.  Musculoskeletal:        General: No deformity or signs of injury.     Cervical back: Neck supple.  Skin:    Findings: No lesion or rash.  Neurological:     General: No focal deficit present.     Mental Status: She is alert. Mental status is at baseline.     (all labs ordered are listed, but only abnormal results are displayed) Labs Reviewed - No data to display  EKG: None  Radiology: No results found.   Procedures   Medications Ordered in the ED  fluorescein  ophthalmic strip 1 strip (1 strip Left Eye Given by Other 04/24/24 0902)  tetracaine  (PONTOCAINE) 0.5 % ophthalmic  solution 2 drop (2 drops Left Eye Given by Other 04/24/24 9097)                                    Medical Decision Making Risk Prescription drug management.   53 year old female presenting to the emergency department with left eye purulence and redness.  She was diagnosed with bacterial conjunctivitis and has been taking the Polytrim  drops without significant relief.  She is not sexually active and is not concerned for sexually transmitted infections.  She has been doing warm compresses.  She states that she has had persistent symptoms.  She endorses mild pain, denies any significant foreign body sensation or injury to her eye.  On arrival, the patient was vitally stable.  On exam, the patient had evidence of bacterial conjunctivitis, fluorescein  staining was negative with no evidence of corneal abrasion or ulceration.  Patient without concern for STI, not currently sexually active.  She does not wear contact lenses.   No risk factors for Pseudomonas, not sexually active, will switch to erythromycin  ointment and have the patient follow-up closely with ophthalmology in clinic, advised to schedule an appointment later today for close follow-up this week.     Final diagnoses:  Bacterial conjunctivitis of left eye    ED Discharge Orders          Ordered    erythromycin  ophthalmic ointment        04/24/24 0917               Jerrol Agent, MD 04/24/24 [phone]

## 2024-04-24 NOTE — ED Triage Notes (Signed)
 Pt reports that she was seen here on Saturday and diagnose with bacterial conjunctivitis and given eye drops but not working. Continue to experience left eye redness, swelling, drainage and pain.

## 2024-04-24 NOTE — Discharge Instructions (Addendum)
 Please schedule an appointment for close follow-up with ophthalmology today given your lack of improvement.  Trial erythromycin  ointment 5 times a day for the next 5 days and follow-up closely with ophthalmology.

## 2024-06-04 ENCOUNTER — Encounter (HOSPITAL_BASED_OUTPATIENT_CLINIC_OR_DEPARTMENT_OTHER): Payer: Self-pay | Admitting: Urology

## 2024-06-04 ENCOUNTER — Emergency Department (HOSPITAL_BASED_OUTPATIENT_CLINIC_OR_DEPARTMENT_OTHER)
Admission: EM | Admit: 2024-06-04 | Discharge: 2024-06-04 | Disposition: A | Discharge status: Home / Self Care | Attending: Emergency Medicine | Admitting: Emergency Medicine

## 2024-06-04 ENCOUNTER — Other Ambulatory Visit: Payer: Self-pay

## 2024-06-04 DIAGNOSIS — R42 Dizziness and giddiness: Secondary | ICD-10-CM | POA: Diagnosis present

## 2024-06-04 DIAGNOSIS — Z7982 Long term (current) use of aspirin: Secondary | ICD-10-CM | POA: Diagnosis not present

## 2024-06-04 MED ORDER — MECLIZINE HCL 25 MG PO TABS: 25.0000 mg | ORAL_TABLET | Freq: Three times a day (TID) | ORAL | 0 refills | Status: AC | PRN

## 2024-06-04 MED ORDER — CHLORTHALIDONE 25 MG PO TABS: 25.0000 mg | ORAL_TABLET | Freq: Every day | ORAL | 0 refills | Status: AC

## 2024-06-04 MED ORDER — ONDANSETRON 4 MG PO TBDP: 4.0000 mg | ORAL_TABLET | Freq: Three times a day (TID) | ORAL | 0 refills | Status: AC | PRN

## 2024-06-04 MED ORDER — MECLIZINE HCL 25 MG PO TABS
25.0000 mg | ORAL_TABLET | Freq: Once | ORAL | Status: AC
  Administered 2024-06-04: 25 mg via ORAL
  Filled 2024-06-04: qty 1

## 2024-06-04 MED ORDER — ONDANSETRON 4 MG PO TBDP
4.0000 mg | ORAL_TABLET | Freq: Once | ORAL | Status: AC
  Administered 2024-06-04: 4 mg via ORAL
  Filled 2024-06-04: qty 1

## 2024-06-04 MED ORDER — LOSARTAN POTASSIUM 50 MG PO TABS: 50.0000 mg | ORAL_TABLET | Freq: Every day | ORAL | 0 refills | Status: AC

## 2024-06-04 NOTE — Discharge Instructions (Signed)
 As we discussed, this is likely an inner ear problem.  I would like for you to follow-up with your primary care doctor.  You can return to the emergency department for any worsening symptoms.  I have refilled your blood pressure medications and given you 2 more prescriptions 1 for Zofran  and the other for dizziness.

## 2024-06-04 NOTE — ED Provider Notes (Signed)
 [place] [facility] Provider Note   CSN: [id number] Arrival date & time: 06/04/24  1402     Patient presents with: Dizziness   Kristine Bell is a 53 y.o. female patient who presents to the emergency room today for further evaluation of dizziness.  This occurred at 1 AM when she got up out of bed to try to drink some water.  She describes the dizziness as a room spinning sensation.  Has been constant since onset but steadily improving.  It is worse when looking to the left and when laying down flat.  She denies any focal weakness or numbness.  Does endorse nausea and vomiting secondary to dizziness.    Dizziness      Prior to Admission medications   Medication Sig Start Date End Date Taking? Authorizing Provider  chlorthalidone  (HYGROTON ) 25 MG tablet Take 1 tablet (25 mg total) by mouth daily. 06/04/24  Yes Theotis, Michai Dieppa M, PA-C  losartan  (COZAAR ) 50 MG tablet Take 1 tablet (50 mg total) by mouth daily. 06/04/24  Yes Theotis, Josanna Hefel M, PA-C  meclizine  (ANTIVERT ) 25 MG tablet Take 1 tablet (25 mg total) by mouth 3 (three) times daily as needed for dizziness. 06/04/24  Yes Theotis, Libbie Bartley M, PA-C  ondansetron  (ZOFRAN -ODT) 4 MG disintegrating tablet Take 1 tablet (4 mg total) by mouth every 8 (eight) hours as needed for nausea or vomiting. 06/04/24  Yes Theotis, Daniqua Campoy M, PA-C  albuterol  (VENTOLIN  HFA) 108 (90 Base) MCG/ACT inhaler Inhale 1-2 puffs into the lungs every 6 (six) hours as needed for wheezing or shortness of breath. 08/30/22   Small, Brooke L, PA  amoxicillin  (AMOXIL ) 500 MG capsule Take 1 capsule (500 mg total) by mouth 3 (three) times daily. 01/02/24   Francesca Elsie CROME, MD  aspirin  EC 81 MG tablet Take 1 tablet (81 mg total) by mouth daily. Swallow whole. 05/23/23   Silver Wonda LABOR, PA  atorvastatin  (LIPITOR) 10 MG tablet Take 1 tablet (10 mg total) by mouth daily. 05/23/23   Silver Wonda LABOR, PA  atorvastatin  (LIPITOR) 10 MG tablet Take  1 tablet (10 mg total) by mouth daily. 04/20/24   Theotis Peers M, PA-C  benzonatate  (TESSALON ) 100 MG capsule Take 1 capsule (100 mg total) by mouth 3 (three) times daily as needed for cough. 05/23/23   Silver Wonda LABOR, PA  buPROPion  (WELLBUTRIN  XL) 150 MG 24 hr tablet 1 tab po q day 11/03/20   Saguier, [place], PA-C  chlorthalidone  (HYGROTON ) 25 MG tablet Take 1 tablet (25 mg total) by mouth daily. 05/23/23   Silver Wonda LABOR, PA  chlorthalidone  (HYGROTON ) 25 MG tablet Take 1 tablet (25 mg total) by mouth daily. 04/20/24   Theotis Peers M, PA-C  cyclobenzaprine  (FLEXERIL ) 10 MG tablet Take 1 tablet (10 mg total) by mouth 2 (two) times daily as needed for muscle spasms. 10/21/23   Zelaya, Oscar A, PA-C  erythromycin  ophthalmic ointment Place a 1/2 inch ribbon of ointment into the lower eyelid for 5 days 04/24/24   Jerrol Agent, MD  lidocaine  (HM LIDOCAINE  PATCH) 4 % Place 2 patches onto the skin daily. 08/30/22   Small, Brooke L, PA  loratadine  (CLARITIN ) 10 MG tablet Take 1 tablet (10 mg total) by mouth daily. 11/24/23   Bernis Ernst, PA-C  losartan  (COZAAR ) 50 MG tablet Take 1 tablet (50 mg total) by mouth daily. 05/23/23   Silver Wonda LABOR, PA  losartan  (COZAAR ) 50 MG tablet Take 1 tablet (50 mg total)  by mouth daily. 04/20/24   Theotis Peers M, PA-C  meloxicam  (MOBIC ) 7.5 MG tablet Take 1 tablet (7.5 mg total) by mouth daily. 06/19/22   Geiple, Joshua, PA-C  metFORMIN  (GLUCOPHAGE ) 500 MG tablet Take 1 tablet (500 mg total) by mouth 2 (two) times daily with a meal. 11/03/20   Saguier, [place], PA-C  methocarbamol  (ROBAXIN ) 500 MG tablet Take 1 tablet (500 mg total) by mouth 2 (two) times daily as needed for muscle spasms. 03/06/23   Dean Clarity, MD  Multiple Vitamin (MULTI-VITAMIN) tablet Take 1 tablet by mouth daily.    [provider]  omeprazole  (PRILOSEC) 20 MG capsule Take 1 capsule (20 mg total) by mouth daily. 11/03/20   Saguier, [place], PA-C  oseltamivir  (TAMIFLU ) 75 MG capsule Take  1 capsule (75 mg total) by mouth every 12 (twelve) hours. 08/30/22   Small, Brooke L, PA  oxybutynin (DITROPAN) 5 MG tablet Take 5 mg by mouth 2 (two) times daily. 09/01/20   [provider]  oxyCODONE  (ROXICODONE ) 5 MG immediate release tablet Take 1 tablet (5 mg total) by mouth every 6 (six) hours as needed for breakthrough pain. 01/02/24   Francesca Elsie CROME, MD  traZODone  (DESYREL ) 50 MG tablet Take 0.5-1 tablets (25-50 mg total) by mouth at bedtime as needed for sleep. 11/03/20   Saguier, [place], PA-C    Allergies: Naproxen, Naproxen sodium, Aspirin , Aspirin -acetaminophen , Sertraline, and Tramadol    Review of Systems  Neurological:  Positive for dizziness.  All other systems reviewed and are negative.   Updated Vital Signs BP (!) 151/75 (BP Location: Left Arm)   Pulse 70   Temp 97.7 F (36.5 C)   Resp 20   Ht 5' 5 (1.651 m)   Wt (!) 147.4 kg   SpO2 97%   BMI 54.08 kg/m   Physical Exam Vitals and nursing note reviewed.  Constitutional:      General: She is not in acute distress.    Appearance: Normal appearance.  HENT:     Head: Normocephalic and atraumatic.  Eyes:     General:        Right eye: No discharge.        Left eye: No discharge.  Cardiovascular:     Comments: Regular rate and rhythm.  S1/S2 are distinct without any evidence of murmur, rubs, or gallops.  Radial pulses are 2+ bilaterally.  Dorsalis pedis pulses are 2+ bilaterally.  No evidence of pedal edema. Pulmonary:     Comments: Clear to auscultation bilaterally.  Normal effort.  No respiratory distress.  No evidence of wheezes, rales, or rhonchi heard throughout. Abdominal:     General: Abdomen is flat. Bowel sounds are normal. There is no distension.     Tenderness: There is no abdominal tenderness. There is no guarding or rebound.  Musculoskeletal:        General: Normal range of motion.     Cervical back: Neck supple.  Skin:    General: Skin is warm and dry.     Findings: No rash.   Neurological:     General: No focal deficit present.     Mental Status: She is alert.     Comments: Cranial nerves II to XII are intact.  5/5 strength to the upper and lower extremities.  Normal sensation to the upper and lower extremities.  No dysmetria with finger-nose.  Psychiatric:        Mood and Affect: Mood normal.        Behavior: Behavior  normal.     (all labs ordered are listed, but only abnormal results are displayed) Labs Reviewed - No data to display  EKG: None  Radiology: No results found.   Procedures   Medications Ordered in the ED  meclizine  (ANTIVERT ) tablet 25 mg (25 mg Oral Given 06/04/24 1525)  ondansetron  (ZOFRAN -ODT) disintegrating tablet 4 mg (4 mg Oral Given 06/04/24 1525)    Clinical Course as of 06/04/24 1557  Tue Jun 04, 2024  1552 On repeat evaluation, patient states that her dizziness is completely resolved.  Nausea has gone away after Zofran .  She is requesting refills of her blood pressure medications.  She has been out for 2 days. [CF]    Clinical Course User Index [CF] Theotis Cameron HERO, PA-C    Medical Decision Making Rielyn Krupinski is a 53 y.o. female patient who presents to the emergency department today for further evaluation of dizziness.  This seems to be consistent with BPPV.  Low suspicion for central process at this time.  Her neurological exam is completely normal.  She does have some slight nystagmus when looking to the left.  I performed an Epley maneuver at the bedside.  Patient's dizziness has now completely resolved.  She states she is back to baseline.  Will plan to give her some meclizine  and some Zofran  as she is having some nausea.  This is likely benign positional peripheral vertigo.  Patient symptoms were resolved after Epley maneuver.  They have yet to return.  Will prescribe her her blood pressure medications that she is out and meclizine  and Zofran .  Strict return precautions were discussed.  She is safer discharge.  Again  low suspicion for stroke at this time as her symptoms have completely resolved.  Risk Prescription drug management.     Final diagnoses:  Dizziness    ED Discharge Orders          Ordered    chlorthalidone  (HYGROTON ) 25 MG tablet  Daily        06/04/24 1554    losartan  (COZAAR ) 50 MG tablet  Daily        06/04/24 1554    meclizine  (ANTIVERT ) 25 MG tablet  3 times daily PRN        06/04/24 1554    ondansetron  (ZOFRAN -ODT) 4 MG disintegrating tablet  Every 8 hours PRN        06/04/24 1554               Theotis Cameron Gibsonia, PA-C 06/04/24 1557    Freddi Hamilton, MD 06/05/24 [phone]

## 2024-06-04 NOTE — ED Notes (Signed)
 Dc instructions given. Pt verbalized understanding. Out of ED with steady gait, not in visible distress.

## 2024-06-04 NOTE — ED Triage Notes (Signed)
 Pt ambulatory to triage  Since 0100 last night has episode of emesis and states room was spinning when laying down  Denies pain

## 2024-06-30 ENCOUNTER — Emergency Department (HOSPITAL_BASED_OUTPATIENT_CLINIC_OR_DEPARTMENT_OTHER)

## 2024-06-30 ENCOUNTER — Encounter (HOSPITAL_BASED_OUTPATIENT_CLINIC_OR_DEPARTMENT_OTHER): Payer: Self-pay | Admitting: Emergency Medicine

## 2024-06-30 ENCOUNTER — Emergency Department (HOSPITAL_BASED_OUTPATIENT_CLINIC_OR_DEPARTMENT_OTHER)
Admission: EM | Admit: 2024-06-30 | Discharge: 2024-06-30 | Disposition: A | Discharge status: Home / Self Care | Attending: Emergency Medicine | Admitting: Emergency Medicine

## 2024-06-30 DIAGNOSIS — Z7984 Long term (current) use of oral hypoglycemic drugs: Secondary | ICD-10-CM | POA: Diagnosis not present

## 2024-06-30 DIAGNOSIS — E119 Type 2 diabetes mellitus without complications: Secondary | ICD-10-CM | POA: Insufficient documentation

## 2024-06-30 DIAGNOSIS — G8929 Other chronic pain: Secondary | ICD-10-CM | POA: Insufficient documentation

## 2024-06-30 DIAGNOSIS — Z7982 Long term (current) use of aspirin: Secondary | ICD-10-CM | POA: Diagnosis not present

## 2024-06-30 DIAGNOSIS — M25511 Pain in right shoulder: Secondary | ICD-10-CM | POA: Insufficient documentation

## 2024-06-30 LAB — CBC WITH DIFFERENTIAL/PLATELET
Abs Immature Granulocytes: 0.05 K/uL (ref 0.00–0.07)
Basophils Absolute: 0.1 K/uL (ref 0.0–0.1)
Basophils Relative: 1 %
Eosinophils Absolute: 0.2 K/uL (ref 0.0–0.5)
Eosinophils Relative: 2 %
HCT: 39.9 % (ref 36.0–46.0)
Hemoglobin: 12.8 g/dL (ref 12.0–15.0)
Immature Granulocytes: 1 %
Lymphocytes Relative: 29 %
Lymphs Abs: 2.8 K/uL (ref 0.7–4.0)
MCH: 26.4 pg (ref 26.0–34.0)
MCHC: 32.1 g/dL (ref 30.0–36.0)
MCV: 82.4 fL (ref 80.0–100.0)
Monocytes Absolute: 0.7 K/uL (ref 0.1–1.0)
Monocytes Relative: 7 %
Neutro Abs: 5.9 K/uL (ref 1.7–7.7)
Neutrophils Relative %: 60 %
Platelets: 276 K/uL (ref 150–400)
RBC: 4.84 MIL/uL (ref 3.87–5.11)
RDW: 15 % (ref 11.5–15.5)
WBC: 9.8 K/uL (ref 4.0–10.5)
nRBC: 0 % (ref 0.0–0.2)

## 2024-06-30 LAB — BASIC METABOLIC PANEL WITH GFR
Anion gap: 10 (ref 5–15)
BUN: 14 mg/dL (ref 6–20)
CO2: 29 mmol/L (ref 22–32)
Calcium: 10.4 mg/dL — ABNORMAL HIGH (ref 8.9–10.3)
Chloride: 101 mmol/L (ref 98–111)
Creatinine, Ser: 0.67 mg/dL (ref 0.44–1.00)
GFR, Estimated: >60 mL/min (ref >60)
Glucose, Bld: 314 mg/dL — ABNORMAL HIGH (ref 70–99)
Potassium: 4 mmol/L (ref 3.5–5.1)
Sodium: 139 mmol/L (ref 135–145)

## 2024-06-30 MED ORDER — KETOROLAC TROMETHAMINE 15 MG/ML IJ SOLN
15.0000 mg | Freq: Once | INTRAMUSCULAR | Status: AC
  Administered 2024-06-30: 15 mg via INTRAMUSCULAR

## 2024-06-30 MED ORDER — KETOROLAC TROMETHAMINE 15 MG/ML IJ SOLN: 15.0000 mg | Freq: Once | INTRAMUSCULAR | Status: DC

## 2024-06-30 MED ORDER — LIDOCAINE 5 % EX PTCH: 1.0000 | MEDICATED_PATCH | CUTANEOUS | 0 refills | Status: AC

## 2024-06-30 MED ORDER — KETOROLAC TROMETHAMINE 15 MG/ML IJ SOLN
15.0000 mg | Freq: Once | INTRAMUSCULAR | Status: DC
  Filled 2024-06-30: qty 1

## 2024-06-30 MED ORDER — CELECOXIB 200 MG PO CAPS: 200.0000 mg | ORAL_CAPSULE | Freq: Two times a day (BID) | ORAL | 1 refills | Status: AC

## 2024-06-30 NOTE — ED Triage Notes (Signed)
 Pt c/o RT shoulder pain for years; she was supposed to do PT, then have an MRI, but did not have insurance; now pain extends to neck, back and arm; has difficulty with ROM

## 2024-06-30 NOTE — ED Provider Notes (Signed)
 [place] [facility] AT [facility] Provider Note   CSN: [id number] Arrival date & time: 06/30/24  1136     Patient presents with: Shoulder Pain   Kristine Bell is a 53 y.o. female who presents today with chronic right shoulder pain though states that over the last 3 days the right shoulder pain has extended down the right arm to through the elbow and forearm as well as noting increased pain in the scapula on the right side down to the mid back on the right side.  She denies have any overt numbness or paresthesia, though does endorse that she has carpal tunnel syndrome specifically on the right wrist and does have intermittent paresthesias associated with that, no increase in that particular symptom.  Previously following with orthopedics, and previously undergoing physical therapy for the same, had a change of insurance and was in between orthopedic practices and has not had opportunity to follow-up with them.  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  She endorses limited range of motion of the right shoulder in flexion and extension as well as abduction and adduction of the shoulder.  Has had limitation in her activities at work secondary to her pain.  Review of previous medical diagnoses shows a previous diagnosis of adhesive capsulitis of the right shoulder, type 2 diabetes, chronic pain syndrome.    Shoulder Pain      Prior to Admission medications   Medication Sig Start Date End Date Taking? Authorizing Provider  celecoxib (CELEBREX) 200 MG capsule Take 1 capsule (200 mg total) by mouth 2 (two) times daily. 06/30/24  Yes Myriam Carrier C, PA  lidocaine  (LIDODERM ) 5 % Place 1 patch onto the skin daily. Remove & Discard patch within 12 hours or as directed by MD 06/30/24  Yes Myriam Carrier BROCKS, PA  albuterol  (VENTOLIN  HFA) 108 (90 Base) MCG/ACT inhaler Inhale 1-2 puffs into the lungs every 6 (six) hours as  needed for wheezing or shortness of breath. 08/30/22   Small, Brooke L, PA  amoxicillin  (AMOXIL ) 500 MG capsule Take 1 capsule (500 mg total) by mouth 3 (three) times daily. 01/02/24   Francesca Elsie CROME, MD  aspirin  EC 81 MG tablet Take 1 tablet (81 mg total) by mouth daily. Swallow whole. 05/23/23   Silver Wonda LABOR, PA  atorvastatin  (LIPITOR) 10 MG tablet Take 1 tablet (10 mg total) by mouth daily. 05/23/23   Silver Wonda LABOR, PA  atorvastatin  (LIPITOR) 10 MG tablet Take 1 tablet (10 mg total) by mouth daily. 04/20/24   Theotis Cameron HERO, PA-C  benzonatate  (TESSALON ) 100 MG capsule Take 1 capsule (100 mg total) by mouth 3 (three) times daily as needed for cough. 05/23/23   Silver Wonda LABOR, PA  buPROPion  (WELLBUTRIN  XL) 150 MG 24 hr tablet 1 tab po q day 11/03/20   Saguier, [place], PA-C  chlorthalidone  (HYGROTON ) 25 MG tablet Take 1 tablet (25 mg total) by mouth daily. 05/23/23   Silver Wonda LABOR, PA  chlorthalidone  (HYGROTON ) 25 MG tablet Take 1 tablet (25 mg total) by mouth daily. 04/20/24   Theotis Cameron M, PA-C  chlorthalidone  (HYGROTON ) 25 MG tablet Take 1 tablet (25 mg total) by mouth daily. 06/04/24   Theotis Cameron M, PA-C  cyclobenzaprine  (FLEXERIL ) 10 MG tablet Take 1 tablet (10 mg total) by mouth 2 (two) times daily as needed for muscle spasms. 10/21/23   Zelaya, Oscar A, PA-C  erythromycin  ophthalmic ointment Place a 1/2 inch  ribbon of ointment into the lower eyelid for 5 days 04/24/24   Jerrol Agent, MD  lidocaine  (HM LIDOCAINE  PATCH) 4 % Place 2 patches onto the skin daily. 08/30/22   Small, Brooke L, PA  loratadine  (CLARITIN ) 10 MG tablet Take 1 tablet (10 mg total) by mouth daily. 11/24/23   Bernis Ernst, PA-C  losartan  (COZAAR ) 50 MG tablet Take 1 tablet (50 mg total) by mouth daily. 05/23/23   Silver Wonda LABOR, PA  losartan  (COZAAR ) 50 MG tablet Take 1 tablet (50 mg total) by mouth daily. 04/20/24   Theotis Peers M, PA-C  losartan  (COZAAR ) 50 MG tablet Take 1 tablet (50 mg total) by  mouth daily. 06/04/24   Theotis Peers M, PA-C  meclizine  (ANTIVERT ) 25 MG tablet Take 1 tablet (25 mg total) by mouth 3 (three) times daily as needed for dizziness. 06/04/24   Theotis Peers HERO, PA-C  metFORMIN  (GLUCOPHAGE ) 500 MG tablet Take 1 tablet (500 mg total) by mouth 2 (two) times daily with a meal. 11/03/20   Saguier, [place], PA-C  methocarbamol  (ROBAXIN ) 500 MG tablet Take 1 tablet (500 mg total) by mouth 2 (two) times daily as needed for muscle spasms. 03/06/23   Dean Clarity, MD  Multiple Vitamin (MULTI-VITAMIN) tablet Take 1 tablet by mouth daily.    [provider]  omeprazole  (PRILOSEC) 20 MG capsule Take 1 capsule (20 mg total) by mouth daily. 11/03/20   Saguier, [place], PA-C  ondansetron  (ZOFRAN -ODT) 4 MG disintegrating tablet Take 1 tablet (4 mg total) by mouth every 8 (eight) hours as needed for nausea or vomiting. 06/04/24   Theotis Peers M, PA-C  oseltamivir  (TAMIFLU ) 75 MG capsule Take 1 capsule (75 mg total) by mouth every 12 (twelve) hours. 08/30/22   Small, Brooke L, PA  oxybutynin (DITROPAN) 5 MG tablet Take 5 mg by mouth 2 (two) times daily. 09/01/20   [provider]  oxyCODONE  (ROXICODONE ) 5 MG immediate release tablet Take 1 tablet (5 mg total) by mouth every 6 (six) hours as needed for breakthrough pain. 01/02/24   Francesca Elsie CROME, MD  traZODone  (DESYREL ) 50 MG tablet Take 0.5-1 tablets (25-50 mg total) by mouth at bedtime as needed for sleep. 11/03/20   Saguier, [place], PA-C    Allergies: Naproxen, Naproxen sodium, Aspirin , Aspirin -acetaminophen , Sertraline, and Tramadol    Review of Systems  Musculoskeletal:  Positive for arthralgias.  All other systems reviewed and are negative.   Updated Vital Signs BP (!) 150/86 (BP Location: Left Arm)   Pulse 74   Temp 98.1 F (36.7 C)   Resp 20   Ht 5' 5 (1.651 m)   Wt (!) 154.2 kg   SpO2 98%   BMI 56.58 kg/m   Physical Exam Vitals and nursing note reviewed.  Constitutional:      General:  She is awake. She is not in acute distress.    Appearance: Normal appearance. She is well-developed. She is obese. She is not ill-appearing or toxic-appearing.  HENT:     Head: Normocephalic and atraumatic.     Mouth/Throat:     Mouth: Mucous membranes are moist.     Pharynx: Oropharynx is clear.  Eyes:     Extraocular Movements: Extraocular movements intact.     Conjunctiva/sclera: Conjunctivae normal.     Pupils: Pupils are equal, round, and reactive to light.  Cardiovascular:     Rate and Rhythm: Normal rate and regular rhythm.     Pulses: Normal pulses.     Heart sounds:  Normal heart sounds. No murmur heard.    No friction rub. No gallop.  Pulmonary:     Effort: Pulmonary effort is normal.     Breath sounds: Normal breath sounds.  Abdominal:     General: Abdomen is flat. Bowel sounds are normal.     Palpations: Abdomen is soft.  Musculoskeletal:     Right shoulder: Tenderness and bony tenderness present. No swelling, deformity or crepitus. Decreased range of motion.     Left shoulder: Normal.     Cervical back: Normal range of motion and neck supple.     Right lower leg: No edema.     Left lower leg: No edema.     Comments: Tenderness to palpation of the acromioclavicular junction, as well as to the coracoid process.  Also has tenderness to the scapular spine.  Limited passive range of motion to approximately 75 degrees abduction, further cannot passively extend the shoulder beyond 45 degrees without significant discomfort.  Limited internal/external rotation as well.  No crepitus is appreciated with passive or active range of motion.  Skin:    General: Skin is warm and dry.     Capillary Refill: Capillary refill takes less than 2 seconds.  Neurological:     General: No focal deficit present.     Mental Status: She is alert and oriented to person, place, and time. Mental status is at baseline.     GCS: GCS eye subscore is 4. GCS verbal subscore is 5. GCS motor subscore is 6.      Cranial Nerves: Cranial nerves 2-12 are intact.     Sensory: Sensory deficit present.     Comments: Slight discrepancy and light touch sensation between the left upper and right upper extremity.  Noted decrease sensation to the forearm on the right side, both on the dorsal and ventral aspect.  Psychiatric:        Mood and Affect: Mood normal.        Behavior: Behavior is cooperative.     (all labs ordered are listed, but only abnormal results are displayed) Labs Reviewed  BASIC METABOLIC PANEL WITH GFR - Abnormal; Notable for the following components:      Result Value   Glucose, Bld 314 (*)    Calcium  10.4 (*)    All other components within normal limits  CBC WITH DIFFERENTIAL/PLATELET    EKG: None  Radiology: DG Shoulder Right Result Date: 06/30/2024 CLINICAL DATA:  Right shoulder pain. EXAM: RIGHT SHOULDER - 2+ VIEW COMPARISON:  Radiograph dated 07/16/2023. FINDINGS: No acute fracture or dislocation. There is degenerative changes of the right shoulder with severe narrowing of the glenohumeral joint space. The soft tissues are unremarkable. IMPRESSION: 1. No acute fracture or dislocation. 2. Degenerative changes of the right shoulder. Electronically Signed   By: Vanetta Chou M.D.   On: 06/30/2024 13:46     Procedures   Medications Ordered in the ED  ketorolac  (TORADOL ) 15 MG/ML injection 15 mg (15 mg Intramuscular Given 06/30/24 1422)                                    Medical Decision Making Amount and/or Complexity of Data Reviewed Labs: ordered. Radiology: ordered.  Risk Prescription drug management.   Medical Decision Making:   Kristine Bell is a 54 y.o. female who presented to the ED today with right shoulder pain detailed above.    External chart has  been reviewed including previous imaging. Complete initial physical exam performed, notably the patient  was alert and oriented no apparent distress.  Physical exam as noted with significant decrease in range  of motion secondary to pain..    Reviewed and confirmed nursing documentation for past medical history, family history, social history.    Initial Assessment:   With the patient's presentation of shoulder pain, most likely diagnosis is osteoarthritis.  Further consider worsened adhesive capsulitis as patient does have a history of this.   Initial Plan:  Obtain plain film imaging of the right shoulder as there has been no imaging on the shoulder since 2022. Screening labs including CBC and Metabolic panel to evaluate for infectious or metabolic etiology of disease.  Primary to screen for renal function secondary to lack of recent labs and pharmacotherapy. Objective evaluation as below reviewed   Initial Study Results:   Laboratory  All laboratory results reviewed without evidence of clinically relevant pathology.   Exceptions include: Glucose 314    Radiology:  All images reviewed independently. Agree with radiology report at this time.   DG Shoulder Right Result Date: 06/30/2024 CLINICAL DATA:  Right shoulder pain. EXAM: RIGHT SHOULDER - 2+ VIEW COMPARISON:  Radiograph dated 07/16/2023. FINDINGS: No acute fracture or dislocation. There is degenerative changes of the right shoulder with severe narrowing of the glenohumeral joint space. The soft tissues are unremarkable. IMPRESSION: 1. No acute fracture or dislocation. 2. Degenerative changes of the right shoulder. Electronically Signed   By: Vanetta Chou M.D.   On: 06/30/2024 13:46    Reassessment and Plan:   X-ray imaging does not show any acute changes however does show degenerative changes of the right shoulder.  As a result findings consistent most likely with osteoarthritis of the right shoulder however cannot exclude adhesive capsulitis.  At manage pain initially with ketorolac  after return of BMP, she had some decrease in her pain, however still does have limited range of motion.  Plan at this time is to continue to manage with  oral celecoxib, Lidoderm  patches, and referral to orthopedics for continued management.  She has orthopedic follow-up scheduled, plan is to follow-up as scheduled.  Given negative imaging, reassuring workup, she stable for discharge and outpatient management at this time.       Final diagnoses:  Chronic right shoulder pain    ED Discharge Orders          Ordered    celecoxib (CELEBREX) 200 MG capsule  2 times daily        06/30/24 1500    lidocaine  (LIDODERM ) 5 %  Every 24 hours        06/30/24 1500               Myriam Dorn BROCKS, [place] 06/30/24 1500    Darra Fonda MATSU, MD 07/10/24 0710

## 2024-06-30 NOTE — ED Notes (Signed)
 XR at bedside

## 2024-06-30 NOTE — ED Notes (Signed)
 Pt is here for chronic pain, wants evaluation for same after not seeking out definitive treatment with a specialist.

## 2024-09-19 ENCOUNTER — Other Ambulatory Visit: Payer: Self-pay

## 2024-09-19 ENCOUNTER — Emergency Department (HOSPITAL_BASED_OUTPATIENT_CLINIC_OR_DEPARTMENT_OTHER): Admission: EM | Admit: 2024-09-19 | Discharge: 2024-09-20 | Disposition: A | Discharge status: Home / Self Care

## 2024-09-19 ENCOUNTER — Emergency Department (HOSPITAL_BASED_OUTPATIENT_CLINIC_OR_DEPARTMENT_OTHER)

## 2024-09-19 ENCOUNTER — Encounter (HOSPITAL_BASED_OUTPATIENT_CLINIC_OR_DEPARTMENT_OTHER): Payer: Self-pay | Admitting: Emergency Medicine

## 2024-09-19 DIAGNOSIS — I10 Essential (primary) hypertension: Secondary | ICD-10-CM | POA: Diagnosis not present

## 2024-09-19 DIAGNOSIS — Z7982 Long term (current) use of aspirin: Secondary | ICD-10-CM | POA: Insufficient documentation

## 2024-09-19 DIAGNOSIS — R3129 Other microscopic hematuria: Secondary | ICD-10-CM | POA: Diagnosis not present

## 2024-09-19 DIAGNOSIS — Z7984 Long term (current) use of oral hypoglycemic drugs: Secondary | ICD-10-CM | POA: Diagnosis not present

## 2024-09-19 DIAGNOSIS — Z79899 Other long term (current) drug therapy: Secondary | ICD-10-CM | POA: Insufficient documentation

## 2024-09-19 DIAGNOSIS — E119 Type 2 diabetes mellitus without complications: Secondary | ICD-10-CM | POA: Insufficient documentation

## 2024-09-19 DIAGNOSIS — R110 Nausea: Secondary | ICD-10-CM | POA: Diagnosis not present

## 2024-09-19 DIAGNOSIS — R10A1 Flank pain, right side: Secondary | ICD-10-CM | POA: Insufficient documentation

## 2024-09-19 DIAGNOSIS — M549 Dorsalgia, unspecified: Secondary | ICD-10-CM | POA: Insufficient documentation

## 2024-09-19 DIAGNOSIS — D72829 Elevated white blood cell count, unspecified: Secondary | ICD-10-CM | POA: Diagnosis not present

## 2024-09-19 LAB — URINALYSIS, ROUTINE W REFLEX MICROSCOPIC
Bilirubin Urine: NEGATIVE
Glucose, UA: 100 mg/dL — AB
Ketones, ur: NEGATIVE mg/dL
Leukocytes,Ua: NEGATIVE
Nitrite: NEGATIVE
Protein, ur: NEGATIVE mg/dL
Specific Gravity, Urine: 1.02 (ref 1.005–1.030)
pH: 7 (ref 5.0–8.0)

## 2024-09-19 LAB — CBC
HCT: 40.4 % (ref 36.0–46.0)
Hemoglobin: 13.1 g/dL (ref 12.0–15.0)
MCH: 26.5 pg (ref 26.0–34.0)
MCHC: 32.4 g/dL (ref 30.0–36.0)
MCV: 81.6 fL (ref 80.0–100.0)
Platelets: 259 K/uL (ref 150–400)
RBC: 4.95 MIL/uL (ref 3.87–5.11)
RDW: 14.6 % (ref 11.5–15.5)
WBC: 10.6 K/uL — ABNORMAL HIGH (ref 4.0–10.5)
nRBC: 0 % (ref 0.0–0.2)

## 2024-09-19 LAB — URINALYSIS, MICROSCOPIC (REFLEX)

## 2024-09-19 LAB — BASIC METABOLIC PANEL WITH GFR
Anion gap: 9 (ref 5–15)
BUN: 14 mg/dL (ref 6–20)
CO2: 26 mmol/L (ref 22–32)
Calcium: 10.4 mg/dL — ABNORMAL HIGH (ref 8.9–10.3)
Chloride: 103 mmol/L (ref 98–111)
Creatinine, Ser: 0.5 mg/dL (ref 0.44–1.00)
GFR, Estimated: >60 mL/min
Glucose, Bld: 213 mg/dL — ABNORMAL HIGH (ref 70–99)
Potassium: 4.1 mmol/L (ref 3.5–5.1)
Sodium: 137 mmol/L (ref 135–145)

## 2024-09-19 MED ORDER — MORPHINE SULFATE (PF) 4 MG/ML IV SOLN
4.0000 mg | Freq: Once | INTRAVENOUS | Status: AC
  Administered 2024-09-19: 4 mg via INTRAVENOUS
  Filled 2024-09-19: qty 1

## 2024-09-19 MED ORDER — METHOCARBAMOL 500 MG PO TABS
750.0000 mg | ORAL_TABLET | Freq: Once | ORAL | Status: AC
  Administered 2024-09-19: 750 mg via ORAL
  Filled 2024-09-19: qty 2

## 2024-09-19 MED ORDER — ONDANSETRON HCL 4 MG/2ML IJ SOLN
4.0000 mg | Freq: Once | INTRAMUSCULAR | Status: AC
  Administered 2024-09-19: 4 mg via INTRAVENOUS
  Filled 2024-09-19: qty 2

## 2024-09-19 NOTE — ED Provider Notes (Signed)
 " [place] [facility] Provider Note   CSN: [id number] Arrival date & time: 09/19/24  1743     Patient presents with: Flank Pain (right)   Kristine Bell is a 54 y.o. female with past medical history of diabetes, GERD, hypertension, obesity, and previous kidney stones who presents with right flank pain since yesterday.  Patient states that she is experiencing right sided flank pain that extends into her lower right abdomen.  Patient states this feels similar to when she has had kidney stones in the past.  Patient states that she is having some wax/wane nausea without vomiting.  The patient reports no dysuria, urinary frequency, urgency, or suprapubic discomfort.  She reports no fever, chills, gross hematuria, vaginal discharge or bleeding, or recent trauma. Oral intake has been as normal and pain control at home has been limited.  The patient is in no acute distress.    Flank Pain       Prior to Admission medications  Medication Sig Start Date End Date Taking? Authorizing Provider  methocarbamol  (ROBAXIN ) 500 MG tablet Take 1 tablet (500 mg total) by mouth every 6 (six) hours as needed for up to 7 days for muscle spasms. 09/19/24 09/26/24 Yes Mahmoud Blazejewski L, PA  oxyCODONE -acetaminophen  (PERCOCET/ROXICET) 5-325 MG tablet Take 1 tablet by mouth every 6 (six) hours as needed for up to 5 days for severe pain (pain score 7-10). 09/20/24 09/25/24 Yes Lateefa Crosby L, PA  albuterol  (VENTOLIN  HFA) 108 (90 Base) MCG/ACT inhaler Inhale 1-2 puffs into the lungs every 6 (six) hours as needed for wheezing or shortness of breath. 08/30/22   Small, Brooke L, PA  amoxicillin  (AMOXIL ) 500 MG capsule Take 1 capsule (500 mg total) by mouth 3 (three) times daily. 01/02/24   Francesca Elsie CROME, MD  aspirin  EC 81 MG tablet Take 1 tablet (81 mg total) by mouth daily. Swallow whole. 05/23/23   Silver Wonda LABOR, PA  atorvastatin  (LIPITOR) 10 MG tablet Take 1 tablet (10 mg total) by mouth  daily. 05/23/23   Silver Wonda LABOR, PA  atorvastatin  (LIPITOR) 10 MG tablet Take 1 tablet (10 mg total) by mouth daily. 04/20/24   Theotis Peers M, PA-C  benzonatate  (TESSALON ) 100 MG capsule Take 1 capsule (100 mg total) by mouth 3 (three) times daily as needed for cough. 05/23/23   Silver Wonda LABOR, PA  buPROPion  (WELLBUTRIN  XL) 150 MG 24 hr tablet 1 tab po q day 11/03/20   Saguier, [place], PA-C  celecoxib  (CELEBREX ) 200 MG capsule Take 1 capsule (200 mg total) by mouth 2 (two) times daily. 06/30/24   Myriam Dorn BROCKS, PA  chlorthalidone  (HYGROTON ) 25 MG tablet Take 1 tablet (25 mg total) by mouth daily. 05/23/23   Silver Wonda LABOR, PA  chlorthalidone  (HYGROTON ) 25 MG tablet Take 1 tablet (25 mg total) by mouth daily. 04/20/24   Theotis Peers M, PA-C  chlorthalidone  (HYGROTON ) 25 MG tablet Take 1 tablet (25 mg total) by mouth daily. 06/04/24   Theotis Peers HERO, PA-C  erythromycin  ophthalmic ointment Place a 1/2 inch ribbon of ointment into the lower eyelid for 5 days 04/24/24   Jerrol Agent, MD  lidocaine  (HM LIDOCAINE  PATCH) 4 % Place 2 patches onto the skin daily. 08/30/22   Small, Brooke L, PA  lidocaine  (LIDODERM ) 5 % Place 1 patch onto the skin daily. Remove & Discard patch within 12 hours or as directed by MD 06/30/24   Myriam Dorn BROCKS, PA  loratadine  (CLARITIN ) 10  MG tablet Take 1 tablet (10 mg total) by mouth daily. 11/24/23   Bernis Ernst, PA-C  losartan  (COZAAR ) 50 MG tablet Take 1 tablet (50 mg total) by mouth daily. 05/23/23   Silver Wonda LABOR, PA  losartan  (COZAAR ) 50 MG tablet Take 1 tablet (50 mg total) by mouth daily. 04/20/24   Theotis Cameron HERO, PA-C  losartan  (COZAAR ) 50 MG tablet Take 1 tablet (50 mg total) by mouth daily. 06/04/24   Theotis Cameron M, PA-C  meclizine  (ANTIVERT ) 25 MG tablet Take 1 tablet (25 mg total) by mouth 3 (three) times daily as needed for dizziness. 06/04/24   Theotis Cameron M, PA-C  metFORMIN  (GLUCOPHAGE ) 500 MG tablet Take 1 tablet (500 mg total)  by mouth 2 (two) times daily with a meal. 11/03/20   Saguier, [place], PA-C  Multiple Vitamin (MULTI-VITAMIN) tablet Take 1 tablet by mouth daily.    [provider]  omeprazole  (PRILOSEC) 20 MG capsule Take 1 capsule (20 mg total) by mouth daily. 11/03/20   Saguier, [place], PA-C  ondansetron  (ZOFRAN -ODT) 4 MG disintegrating tablet Take 1 tablet (4 mg total) by mouth every 8 (eight) hours as needed for nausea or vomiting. 06/04/24   Theotis Cameron M, PA-C  oseltamivir  (TAMIFLU ) 75 MG capsule Take 1 capsule (75 mg total) by mouth every 12 (twelve) hours. 08/30/22   Small, Brooke L, PA  oxybutynin (DITROPAN) 5 MG tablet Take 5 mg by mouth 2 (two) times daily. 09/01/20   [provider]  traZODone  (DESYREL ) 50 MG tablet Take 0.5-1 tablets (25-50 mg total) by mouth at bedtime as needed for sleep. 11/03/20   Saguier, [place], PA-C    Allergies: Naproxen, Naproxen sodium, Aspirin , Aspirin -acetaminophen , Sertraline, and Tramadol    Review of Systems  Genitourinary:  Positive for flank pain.    Updated Vital Signs BP (!) 148/72 (BP Location: Right Arm)   Pulse 68   Temp 98.1 F (36.7 C) (Oral)   Resp 16   Wt (!) 147.4 kg   SpO2 100%   BMI 54.08 kg/m   Physical Exam Vitals and nursing note reviewed.  Constitutional:      General: She is awake. She is not in acute distress.    Appearance: Normal appearance. She is not toxic-appearing.  HENT:     Head: Normocephalic and atraumatic.     Right Ear: Hearing normal.     Left Ear: Hearing normal.     Nose: Nose normal.  Eyes:     Extraocular Movements: Extraocular movements intact.     Conjunctiva/sclera: Conjunctivae normal.     Pupils: Pupils are equal, round, and reactive to light.  Cardiovascular:     Rate and Rhythm: Normal rate and regular rhythm.     Pulses: Normal pulses.  Pulmonary:     Effort: Pulmonary effort is normal. No respiratory distress.     Comments: Patient has no difficulty speaking in complete  sentences. Abdominal:     General: Abdomen is flat. Bowel sounds are normal.     Palpations: Abdomen is soft.     Tenderness: There is no abdominal tenderness. There is right CVA tenderness.     Comments: No abdominal tenderness. Right sided CVA tenderness, guarding, or rebound. No hernia appreciated on exam. There is no ecchymosis or additional skin changes to the abdomen.   Musculoskeletal:        General: Normal range of motion.     Cervical back: Normal range of motion.  Skin:    General: Skin is  warm and dry.     Capillary Refill: Capillary refill takes less than 2 seconds.  Neurological:     General: No focal deficit present.     Mental Status: She is alert. Mental status is at baseline.  Psychiatric:        Mood and Affect: Mood normal.     (all labs ordered are listed, but only abnormal results are displayed) Labs Reviewed  CBC - Abnormal; Notable for the following components:      Result Value   WBC 10.6 (*)    All other components within normal limits  BASIC METABOLIC PANEL WITH GFR - Abnormal; Notable for the following components:   Glucose, Bld 213 (*)    Calcium  10.4 (*)    All other components within normal limits  URINALYSIS, ROUTINE W REFLEX MICROSCOPIC - Abnormal; Notable for the following components:   APPearance HAZY (*)    Glucose, UA 100 (*)    Hgb urine dipstick TRACE (*)    All other components within normal limits  URINALYSIS, MICROSCOPIC (REFLEX) - Abnormal; Notable for the following components:   Bacteria, UA RARE (*)    All other components within normal limits    EKG: None  Radiology: No results found.    Procedures   Medications Ordered in the ED  morphine  (PF) 4 MG/ML injection 4 mg (4 mg Intravenous Given 09/19/24 2059)  ondansetron  (ZOFRAN ) injection 4 mg (4 mg Intravenous Given 09/19/24 2057)  methocarbamol  (ROBAXIN ) tablet 750 mg (750 mg Oral Given 09/19/24 2313)  oxyCODONE -acetaminophen  (PERCOCET/ROXICET) 5-325 MG per tablet 1 tablet  (1 tablet Oral Given 09/20/24 0009)                                 Medical Decision Making Amount and/or Complexity of Data Reviewed Labs: ordered. Radiology: ordered.  Risk Prescription drug management.   Patient presents to the ED for: Right flank pain This involves an extensive number of treatment options  Differential diagnosis includes:  Minor MSK etiology Urolithiasis Intra-abdominal pathology Co-morbid conditions: Diabetes, hypertension, kidney stones, obesity, GERD  Additional history/records obtained and reviewed: Additional history obtained from  husband who presented as good historian   Clinical Course as of 09/24/24 1310  Thu Sep 19, 2024  2005 Temp: 98.1 F (36.7 C) afebrile, vital stable [ML]    Clinical Course User Index [ML] Willma Duwaine CROME, [place]    Data Reviewed / Actions Taken: Labs ordered showed mild leukocytosis. Urinalysis showed microhematuria.   Imaging ordered showed nonobstructing renal calculi. I agree with the radiologists interpretation.   Management / Treatments: Patient given morphine , Zofran , Robaxin , oxycodone  5 mg -well-tolerated  Reevaluation of the patient after these medicines showed that the patient improved. I have reviewed the patients home medicines and have made adjustments as needed  ED Course / Reassessments: Problem List:  54 year old female presented for right flank pain. Initial assessment included history, physical exam, and review of prior medical records. Laboratory evaluation including urinalysis demonstrated microhematuria without leukocytes or nitrites and additional labs showed mild leukocytosis.  Imaging including CT stone study demonstrated nonobstructing renal calculi.  Based on the patient's clinical presentation, laboratory findings, and imaging results, there is no concern for complicated urinary tract infection, pyelonephritis, obstruction, or renal dysfunction. The patient was treated with analgesia, antiemetic, and  hydration with improvement in symptoms and ability to tolerate oral intake.  Given clinical improvement and stable evaluation, the patient was deemed  appropriate for outpatient management.  Discharge planning including prescriptions for oxycodone  and Robaxin  for pain relief, instructions for adequate hydration and pain control. Strict return precautions were discussed including worsening pain, new fever, persistent vomiting, inability to tolerate oral intake, decreased urine output, or new or worsening symptoms.   Disposition: Disposition: Discharge with close follow-up with PCP for further evaluation and care  Rationale for disposition: stable for discharge  The disposition plan and rationale were discussed with the patient at the bedside, all questions were addressed, and the patient demonstrated understanding.  This note was produced using Electronics Engineer. While I have reviewed and verified all clinical information, transcription errors may remain.      Final diagnoses:  Back pain, unspecified back location, unspecified back pain laterality, unspecified chronicity    ED Discharge Orders          Ordered    oxyCODONE -acetaminophen  (PERCOCET/ROXICET) 5-325 MG tablet  Every 6 hours PRN        09/20/24 0003    methocarbamol  (ROBAXIN ) 500 MG tablet  Every 6 hours PRN        09/20/24 0003               Devita Nies L, PA 09/24/24 1311    Ula Prentice SAUNDERS, MD 09/27/24 [phone]  "

## 2024-09-19 NOTE — Discharge Instructions (Signed)
 Thank you for visiting the Emergency Department today. It was a pleasure to be part of your healthcare team.   Your were seen today for back pain, and your test results showed no acute findings.  As discussed, rest, hydrate, resume diet as tolerated, and utilize muscle relaxers and pain management regimen.  Please follow up with your primary care provider within 1 week for reevaluation.   Thank you for trusting us  with your health.

## 2024-09-19 NOTE — ED Triage Notes (Signed)
 Right flank pain radiating to right groin and upper right leg . Denies urinary symptoms . Hx kidney stones .

## 2024-09-20 MED ORDER — OXYCODONE-ACETAMINOPHEN 5-325 MG PO TABS
1.0000 | ORAL_TABLET | Freq: Once | ORAL | Status: AC
  Administered 2024-09-20: 1 via ORAL
  Filled 2024-09-20: qty 1

## 2024-09-20 MED ORDER — METHOCARBAMOL 500 MG PO TABS: 500.0000 mg | ORAL_TABLET | Freq: Four times a day (QID) | ORAL | 0 refills | Status: AC | PRN

## 2024-09-20 MED ORDER — OXYCODONE-ACETAMINOPHEN 5-325 MG PO TABS: 1.0000 | ORAL_TABLET | Freq: Four times a day (QID) | ORAL | 0 refills | Status: AC | PRN
